# Patient Record
Sex: Female | Born: 2000
Health system: Southern US, Community
[De-identification: ages and names within clinical notes are randomized; demographics above are authoritative.]

## PROBLEM LIST (undated history)

## (undated) DIAGNOSIS — J189 Pneumonia, unspecified organism: Secondary | ICD-10-CM

## (undated) DIAGNOSIS — J210 Acute bronchiolitis due to respiratory syncytial virus: Secondary | ICD-10-CM

---
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
## 2021-01-19 ENCOUNTER — Other Ambulatory Visit: Payer: Self-pay | Admitting: Family Medicine

## 2021-01-19 DIAGNOSIS — N898 Other specified noninflammatory disorders of vagina: Secondary | ICD-10-CM

## 2021-01-20 MED ORDER — FLUCONAZOLE 150 MG PO TABS: 150.0000 mg | ORAL_TABLET | ORAL | 0 refills | Status: DC | PRN

## 2021-01-20 NOTE — Telephone Encounter (Signed)
Received Diflucan refill request in my inbox.  Attempted to call patient to discuss as she recently was seen in our office a few weeks ago for possible pregnancy due to late menstrual cycle, however pregnancy test negative at that time.  Would like to follow-up and confirm her normal menstrual cycle/repeat negative test prior to prescribing this medication.  Allayne Stack, DO

## 2021-01-20 NOTE — Telephone Encounter (Signed)
Called patient to discuss. Had her regular menstrual cycle and negative test, due for next cycle in a few days. Has been sexually active since then using condoms for entirety with each encounter. Having vaginal itching and increased discharge. Will trial diflucan, if not improving instructed to come in for formal evaluation.   Allayne Stack, DO

## 2021-01-31 DIAGNOSIS — Z20822 Contact with and (suspected) exposure to covid-19: Secondary | ICD-10-CM | POA: Diagnosis not present

## 2021-04-25 ENCOUNTER — Ambulatory Visit (INDEPENDENT_AMBULATORY_CARE_PROVIDER_SITE_OTHER): Payer: Medicaid Other | Admitting: Family Medicine

## 2021-04-25 ENCOUNTER — Other Ambulatory Visit (HOSPITAL_COMMUNITY)
Admission: RE | Admit: 2021-04-25 | Discharge: 2021-04-25 | Disposition: A | Discharge status: Home / Self Care | Payer: Medicaid Other | Source: Ambulatory Visit | Attending: Family Medicine | Admitting: Family Medicine

## 2021-04-25 ENCOUNTER — Other Ambulatory Visit: Payer: Self-pay

## 2021-04-25 VITALS — BP 116/79 | HR 77 | Ht 60.0 in | Wt 173.4 lb

## 2021-04-25 DIAGNOSIS — Z113 Encounter for screening for infections with a predominantly sexual mode of transmission: Secondary | ICD-10-CM | POA: Insufficient documentation

## 2021-04-25 DIAGNOSIS — N898 Other specified noninflammatory disorders of vagina: Secondary | ICD-10-CM | POA: Diagnosis present

## 2021-04-25 LAB — POCT WET PREP (WET MOUNT)
Clue Cells Wet Prep Whiff POC: POSITIVE
Trichomonas Wet Prep HPF POC: ABSENT

## 2021-04-25 MED ORDER — METRONIDAZOLE 500 MG PO TABS: 500.0000 mg | ORAL_TABLET | Freq: Two times a day (BID) | ORAL | 0 refills | Status: AC

## 2021-04-25 NOTE — Progress Notes (Signed)
    SUBJECTIVE:   CHIEF COMPLAINT / HPI:   Chief Complaint  Patient presents with   Vaginal odor    Madison Schaefer is a 20 y.o. female presents for :  Vaginal Discharge Having vaginal odor for 7 days. Odor is fishy. Reports similar sx in the past with BV. Denies vaginal discharge.  Medications tried: No  No recent antibiotic use  - Denies itching, burning, abdominal pain, dysuria or hematuria, nausea or vomiting, rash, fevers or pelvic pain.  - Patient reports no STIs in the past.  - Sexully active with female partner.  -  Patient's last menstrual period was 04/12/2021.  - Patient reports  BV, yeast in the past.  - Patient denies douching.  -Contraception: none.  Possible STD exposure: unsure.    PERTINENT  PMH / PSH: reviewed and updated as appropriate   OBJECTIVE:   Ht 5' (1.524 m)   Wt 173 lb 6.4 oz (78.7 kg)   LMP 04/12/2021   BMI 33.86 kg/m   GEN: well appearing female in no acute distress  CVS: well perfused  RESP: speaking in full sentences without pause  ABD: soft, non-tender, non-distended, no palpable masses  Pelvic exam: normal external genitalia, vulva, VAGINA and CERVIX: lesions absent, cervical discharge present - white, DNA probe for chlamydia and GC obtained, cervical motion tenderness absent, ADNEXA: normal adnexa in size, nontender and no masses, WET MOUNT done - results: KOH done, clue cells, excessive bacteria, GC/CT probe collected, exam chaperoned by CMA Tashira.     ASSESSMENT/PLAN:   No problem-specific Assessment & Plan notes found for this encounter.    BV (bacterial vaginosis)  Confirmed on wet prep. G/C Percell Locus is pending. Symptoms consistent with this.  - Treatment: Flagyl 500 BID x 7 days and abstain from coitus during course of treatment. Advised patient to not drink alcohol while taking this medication.  - F/U if symptoms not improving or getting worse.  - Will f/u on G/C Chlamydia and call in Rx if positive.  - Self care  instructions given including avoiding douching. Handout given.  - F/U with PCP as needed.  - Return precautions including abdominal pain, fever, chills, nausea, or vomiting given.   High risk heterosexual behavior GC and chlamydia DNA  probe sent to lab. HIV and RPR offered but declined. Advised patient to use barrier protection/condoms.      Katha Cabal, DO PGY-3, [facility] 04/25/2021

## 2021-04-25 NOTE — Patient Instructions (Signed)
Stop by the pharmacy to pick up your antibiotics. Use barrier protection (condoms), consider hormonal birth control. Be sure to avoid alcoholic beverages while taking your antibiotics as this can cause vomiting.   Take Care,   Dr. Rachael Darby

## 2021-04-26 LAB — CERVICOVAGINAL ANCILLARY ONLY
Chlamydia: NEGATIVE
Comment: NEGATIVE
Comment: NORMAL
Neisseria Gonorrhea: NEGATIVE

## 2021-05-11 ENCOUNTER — Telehealth: Payer: Medicaid Other | Admitting: Nurse Practitioner

## 2021-05-11 DIAGNOSIS — B373 Candidiasis of vulva and vagina: Secondary | ICD-10-CM | POA: Diagnosis not present

## 2021-05-11 DIAGNOSIS — B3731 Acute candidiasis of vulva and vagina: Secondary | ICD-10-CM

## 2021-05-11 MED ORDER — FLUCONAZOLE 150 MG PO TABS: 150.0000 mg | ORAL_TABLET | Freq: Once | ORAL | 0 refills | Status: AC

## 2021-05-11 NOTE — Progress Notes (Signed)

## 2021-05-13 ENCOUNTER — Other Ambulatory Visit: Payer: Self-pay

## 2021-05-13 ENCOUNTER — Encounter (HOSPITAL_COMMUNITY): Payer: Self-pay

## 2021-05-13 ENCOUNTER — Ambulatory Visit (HOSPITAL_COMMUNITY)
Admission: RE | Admit: 2021-05-13 | Discharge: 2021-05-13 | Disposition: A | Discharge status: Home / Self Care | Payer: Medicaid Other | Source: Ambulatory Visit | Attending: Student | Admitting: Student

## 2021-05-13 VITALS — BP 124/86 | HR 95 | Temp 98.1°F | Resp 16

## 2021-05-13 DIAGNOSIS — N760 Acute vaginitis: Secondary | ICD-10-CM | POA: Diagnosis not present

## 2021-05-13 DIAGNOSIS — B9689 Other specified bacterial agents as the cause of diseases classified elsewhere: Secondary | ICD-10-CM | POA: Diagnosis not present

## 2021-05-13 MED ORDER — FLUCONAZOLE 150 MG PO TABS: 150.0000 mg | ORAL_TABLET | Freq: Every day | ORAL | 0 refills | Status: DC

## 2021-05-13 MED ORDER — METRONIDAZOLE 500 MG PO TABS: 500.0000 mg | ORAL_TABLET | Freq: Two times a day (BID) | ORAL | 0 refills | Status: DC

## 2021-05-13 NOTE — ED Triage Notes (Signed)
Pt presents with vaginal itching and odor xs 2-3 days.

## 2021-05-13 NOTE — Discharge Instructions (Addendum)
-  For your yeast infection, start the Diflucan (fluconazole)- Take one pill today (day 1). If you're still having symptoms in 3 days, take the second pill.  -For bacterial vaginosis, start the antibiotic-Flagyl (metronidazole), 2 pills daily for 7 days.  You can take this with food if you have a sensitive stomach.  Avoid alcohol while taking this medication and for 2 days after as this will cause severe nausea and vomiting. -We'll call you in about 3 days if we need to change anything about your treatment plan. Abstain from intercourse until symptoms resolve and treatment is complete. -Seek additional medical attention if new/worsening symptoms.

## 2021-05-13 NOTE — ED Provider Notes (Signed)
[facility]    CSN: [id number] Arrival date & time: 05/13/21  1358      History   Chief Complaint Chief Complaint  Patient presents with   Vaginal Itching    APPT    HPI Madison Schaefer is a 20 y.o. female presenting with vaginal itching and odor x3 days. Medical history BV, last diagnosed with this 04/25/21 by primary care and was treated appropriately with Flagyl, states that the symptoms resolved but now she has 3 days of thick white discharge and vaginal itching once again.  Describes the discharge as thick and malodorous.  Denies new partners, STI risk.  Denies hematuria, dysuria, frequency, urgency, back pain, n/v/d/abd pain, fevers/chills, abdnormal vaginal rashes/lesions.    HPI  Past Medical History:  Diagnosis Date   Pneumonia    RSV (acute bronchiolitis due to respiratory syncytial virus)     Patient Active Problem List   Diagnosis Date Noted   Vaginal discharge 10/27/2020   Amenorrhea 09/30/2019    History reviewed. No pertinent surgical history.  OB History   No obstetric history on file.      Home Medications    Prior to Admission medications   Medication Sig Start Date End Date Taking? Authorizing Provider  fluconazole (DIFLUCAN) 150 MG tablet Take 1 tablet (150 mg total) by mouth daily. -For your yeast infection, start the Diflucan (fluconazole)- Take one pill today (day 1). If you're still having symptoms in 3 days, take the second pill. 05/13/21  Yes Rhys Martini, PA-C  metroNIDAZOLE (FLAGYL) 500 MG tablet Take 1 tablet (500 mg total) by mouth 2 (two) times daily. 05/13/21  Yes Rhys Martini, PA-C    Family History History reviewed. No pertinent family history.  Social History Social History   Tobacco Use   Smoking status: Never   Smokeless tobacco: Never  Vaping Use   Vaping Use: Never used  Substance Use Topics   Alcohol use: No   Drug use: No     Allergies   Patient has no known allergies.   Review of  Systems Review of Systems  Constitutional:  Negative for chills and fever.  HENT:  Negative for sore throat.   Eyes:  Negative for pain and redness.  Respiratory:  Negative for shortness of breath.   Cardiovascular:  Negative for chest pain.  Gastrointestinal:  Negative for abdominal pain, diarrhea, nausea and vomiting.  Genitourinary:  Positive for vaginal discharge. Negative for decreased urine volume, difficulty urinating, dysuria, flank pain, frequency, genital sores, hematuria and urgency.  Musculoskeletal:  Negative for back pain.  Skin:  Negative for rash.  All other systems reviewed and are negative.   Physical Exam Triage Vital Signs ED Triage Vitals  Enc Vitals Group     BP 05/13/21 1415 124/86     Pulse Rate 05/13/21 1415 95     Resp 05/13/21 1415 16     Temp 05/13/21 1415 98.1 F (36.7 C)     Temp Source 05/13/21 1415 Oral     SpO2 05/13/21 1415 98 %     Weight --      Height --      Head Circumference --      Peak Flow --      Pain Score 05/13/21 1418 0     Pain Loc --      Pain Edu? --      Excl. in GC? --    No data found.  Updated Vital Signs BP 124/86 (  BP Location: Left Arm)   Pulse 95   Temp 98.1 F (36.7 C) (Oral)   Resp 16   LMP 04/12/2021   SpO2 98%   Visual Acuity Right Eye Distance:   Left Eye Distance:   Bilateral Distance:    Right Eye Near:   Left Eye Near:    Bilateral Near:     Physical Exam Vitals reviewed.  Constitutional:      General: She is not in acute distress.    Appearance: Normal appearance. She is not ill-appearing.  HENT:     Head: Normocephalic and atraumatic.     Mouth/Throat:     Mouth: Mucous membranes are moist.     Comments: Moist mucous membranes Eyes:     Extraocular Movements: Extraocular movements intact.     Pupils: Pupils are equal, round, and reactive to light.  Cardiovascular:     Rate and Rhythm: Normal rate and regular rhythm.     Heart sounds: Normal heart sounds.  Pulmonary:     Effort:  Pulmonary effort is normal.     Breath sounds: Normal breath sounds. No wheezing, rhonchi or rales.  Abdominal:     General: Bowel sounds are normal. There is no distension.     Palpations: Abdomen is soft. There is no mass.     Tenderness: There is no abdominal tenderness. There is no right CVA tenderness, left CVA tenderness, guarding or rebound.  Genitourinary:    Comments: deferred Skin:    General: Skin is warm.     Capillary Refill: Capillary refill takes less than 2 seconds.     Comments: Good skin turgor  Neurological:     General: No focal deficit present.     Mental Status: She is alert and oriented to person, place, and time.  Psychiatric:        Mood and Affect: Mood normal.        Behavior: Behavior normal.     UC Treatments / Results  Labs (all labs ordered are listed, but only abnormal results are displayed) Labs Reviewed  CERVICOVAGINAL ANCILLARY ONLY    EKG   Radiology No results found.  Procedures Procedures (including critical care time)  Medications Ordered in UC Medications - No data to display  Initial Impression / Assessment and Plan / UC Course  I have reviewed the triage vital signs and the nursing notes.  Pertinent labs & imaging results that were available during my care of the patient were reviewed by me and considered in my medical decision making (see chart for details).     This patient is a very pleasant 20 y.o. year old female presenting with vaginitis.  This patient was recently treated for BV with Flagyl with resolution of symptoms; differential today is BV versus vaginal candidiasis.  She denies STI risk. Will send self swab for G/C, trich, yeast, BV testing. States she is not pregnant or breast-feeding.  Discussed risks and benefits of treating today versus waiting for swab results, patient prefers to treat for BV and yeast today.  Flagyl and Diflucan sent.  ED return precautions discussed. Level 4 review of past notes and labs,  order and interpretation labs, and prescription drug management.   Final Clinical Impressions(s) / UC Diagnoses   Final diagnoses:  Bacterial vaginosis     Discharge Instructions      -For your yeast infection, start the Diflucan (fluconazole)- Take one pill today (day 1). If you're still having symptoms in 3 days, take the second  pill.  -For bacterial vaginosis, start the antibiotic-Flagyl (metronidazole), 2 pills daily for 7 days.  You can take this with food if you have a sensitive stomach.  Avoid alcohol while taking this medication and for 2 days after as this will cause severe nausea and vomiting. -We'll call you in about 3 days if we need to change anything about your treatment plan. Abstain from intercourse until symptoms resolve and treatment is complete. -Seek additional medical attention if new/worsening symptoms.     ED Prescriptions     Medication Sig Dispense Auth. Provider   metroNIDAZOLE (FLAGYL) 500 MG tablet Take 1 tablet (500 mg total) by mouth 2 (two) times daily. 14 tablet Rhys Martini, PA-C   fluconazole (DIFLUCAN) 150 MG tablet Take 1 tablet (150 mg total) by mouth daily. -For your yeast infection, start the Diflucan (fluconazole)- Take one pill today (day 1). If you're still having symptoms in 3 days, take the second pill. 2 tablet Rhys Martini, PA-C      PDMP not reviewed this encounter.   Rhys Martini, PA-C 05/13/21 1511

## 2021-05-16 LAB — CERVICOVAGINAL ANCILLARY ONLY
Bacterial Vaginitis (gardnerella): POSITIVE — AB
Candida Glabrata: NEGATIVE
Candida Vaginitis: NEGATIVE
Chlamydia: NEGATIVE
Comment: NEGATIVE
Comment: NEGATIVE
Comment: NEGATIVE
Comment: NEGATIVE
Comment: NEGATIVE
Comment: NORMAL
Neisseria Gonorrhea: NEGATIVE
Trichomonas: NEGATIVE

## 2021-06-25 ENCOUNTER — Telehealth: Payer: Medicaid Other | Admitting: Nurse Practitioner

## 2021-06-25 DIAGNOSIS — B9689 Other specified bacterial agents as the cause of diseases classified elsewhere: Secondary | ICD-10-CM | POA: Diagnosis not present

## 2021-06-25 DIAGNOSIS — N760 Acute vaginitis: Secondary | ICD-10-CM

## 2021-06-25 MED ORDER — METRONIDAZOLE 500 MG PO TABS: 500.0000 mg | ORAL_TABLET | Freq: Two times a day (BID) | ORAL | 0 refills | Status: AC

## 2021-06-25 NOTE — Progress Notes (Signed)
E-Visit for Vaginal Symptoms  We are sorry that you are not feeling well. Here is how we plan to help! Based on what you shared with me it looks like you: May have a vaginosis due to bacteria  Vaginosis is an inflammation of the vagina that can result in discharge, itching and pain. The cause is usually a change in the normal balance of vaginal bacteria or an infection. Vaginosis can also result from reduced estrogen levels after menopause.  The most common causes of vaginosis are:   Bacterial vaginosis which results from an overgrowth of one on several organisms that are normally present in your vagina.   Yeast infections which are caused by a naturally occurring fungus called candida.   Vaginal atrophy (atrophic vaginosis) which results from the thinning of the vagina from reduced estrogen levels after menopause.   Trichomoniasis which is caused by a parasite and is commonly transmitted by sexual intercourse.  Factors that increase your risk of developing vaginosis include: Medications, such as antibiotics and steroids Uncontrolled diabetes Use of hygiene products such as bubble bath, vaginal spray or vaginal deodorant Douching Wearing damp or tight-fitting clothing Using an intrauterine device (IUD) for birth control Hormonal changes, such as those associated with pregnancy, birth control pills or menopause Sexual activity Having a sexually transmitted infection  Your treatment plan is Metronidazole or Flagyl 500mg twice a day for 7 days.  I have electronically sent this prescription into the pharmacy that you have chosen.  Be sure to take all of the medication as directed. Stop taking any medication if you develop a rash, tongue swelling or shortness of breath. Mothers who are breast feeding should consider pumping and discarding their breast milk while on these antibiotics. However, there is no consensus that infant exposure at these doses would be harmful.  Remember that  medication creams can weaken latex condoms. .   HOME CARE:  Good hygiene may prevent some types of vaginosis from recurring and may relieve some symptoms:  Avoid baths, hot tubs and whirlpool spas. Rinse soap from your outer genital area after a shower, and dry the area well to prevent irritation. Don't use scented or harsh soaps, such as those with deodorant or antibacterial action. Avoid irritants. These include scented tampons and pads. Wipe from front to back after using the toilet. Doing so avoids spreading fecal bacteria to your vagina.  Other things that may help prevent vaginosis include:  Don't douche. Your vagina doesn't require cleansing other than normal bathing. Repetitive douching disrupts the normal organisms that reside in the vagina and can actually increase your risk of vaginal infection. Douching won't clear up a vaginal infection. Use a latex condom. Both female and female latex condoms may help you avoid infections spread by sexual contact. Wear cotton underwear. Also wear pantyhose with a cotton crotch. If you feel comfortable without it, skip wearing underwear to bed. Yeast thrives in moist environments Your symptoms should improve in the next day or two.  GET HELP RIGHT AWAY IF:  You have pain in your lower abdomen ( pelvic area or over your ovaries) You develop nausea or vomiting You develop a fever Your discharge changes or worsens You have persistent pain with intercourse You develop shortness of breath, a rapid pulse, or you faint.  These symptoms could be signs of problems or infections that need to be evaluated by a medical provider now.  MAKE SURE YOU   Understand these instructions. Will watch your condition. Will get help right   away if you are not doing well or get worse.  Thank you for choosing an e-visit.  Your e-visit answers were reviewed by a board certified advanced clinical practitioner to complete your personal care plan. Depending upon the  condition, your plan could have included both over the counter or prescription medications.  Please review your pharmacy choice. Make sure the pharmacy is open so you can pick up prescription now. If there is a problem, you may contact your provider through MyChart messaging and have the prescription routed to another pharmacy.  Your safety is important to us. If you have drug allergies check your prescription carefully.   For the next 24 hours you can use MyChart to ask questions about today's visit, request a non-urgent call back, or ask for a work or school excuse. You will get an email in the next two days asking about your experience. I hope that your e-visit has been valuable and will speed your recovery.   I spent approximately 7 minutes reviewing the patient's history, current symptoms and coordinating their plan of care today.    Meds ordered this encounter  Medications   metroNIDAZOLE (FLAGYL) 500 MG tablet    Sig: Take 1 tablet (500 mg total) by mouth 2 (two) times daily for 7 days.    Dispense:  14 tablet    Refill:  0    

## 2021-10-07 ENCOUNTER — Other Ambulatory Visit: Payer: Self-pay

## 2021-10-07 ENCOUNTER — Ambulatory Visit
Admission: RE | Admit: 2021-10-07 | Discharge: 2021-10-07 | Disposition: A | Discharge status: Home / Self Care | Payer: Medicaid Other | Source: Ambulatory Visit | Attending: Internal Medicine | Admitting: Internal Medicine

## 2021-10-07 VITALS — BP 124/81 | HR 81 | Temp 98.0°F | Resp 18

## 2021-10-07 DIAGNOSIS — N912 Amenorrhea, unspecified: Secondary | ICD-10-CM | POA: Diagnosis not present

## 2021-10-07 LAB — POCT URINALYSIS DIP (MANUAL ENTRY)
Bilirubin, UA: NEGATIVE
Blood, UA: NEGATIVE
Glucose, UA: NEGATIVE mg/dL
Leukocytes, UA: NEGATIVE
Nitrite, UA: NEGATIVE
Protein Ur, POC: NEGATIVE mg/dL
Spec Grav, UA: >=1.03 — AB (ref 1.010–1.025)
Urobilinogen, UA: 0.2 E.U./dL
pH, UA: 5.5 (ref 5.0–8.0)

## 2021-10-07 LAB — POCT URINE PREGNANCY: Preg Test, Ur: NEGATIVE

## 2021-10-07 NOTE — ED Triage Notes (Signed)
Pt reports having missed menstrual cycles since July 2022. Patient states her cycles are usually regular and at home pregnancy test have been negative. Patient denies abd, vaginal and back pain.

## 2021-10-07 NOTE — ED Provider Notes (Signed)
[facility]    CSN: [id number] Arrival date & time: 10/07/21  1325      History   Chief Complaint Chief Complaint  Patient presents with   Amenorrhea    HPI Madison Schaefer is a 21 y.o. female presents to urgent care today with complaints of missed menstrual cycles.  Patient states no menstruation since 7/22.  States she is sexually active with single partner, not using any contraception.  Denies any history of STIs.  Denies any abdominal or pelvic pain, no suspicious lesions, no N/V/D.  Patient also reports approximately 20 pound weight gain since July with no real change in diet.  She has been treated for BV as recent as 8/22 and asymptomatic since that treatment.  Patient without history of abnormal Paps.   Past Medical History:  Diagnosis Date   Pneumonia    RSV (acute bronchiolitis due to respiratory syncytial virus)     Patient Active Problem List   Diagnosis Date Noted   Vaginal discharge 10/27/2020   Amenorrhea 09/30/2019    History reviewed. No pertinent surgical history.  OB History   No obstetric history on file.      Home Medications    Prior to Admission medications   Medication Sig Start Date End Date Taking? Authorizing Provider  fluconazole (DIFLUCAN) 150 MG tablet Take 1 tablet (150 mg total) by mouth daily. -For your yeast infection, start the Diflucan (fluconazole)- Take one pill today (day 1). If you're still having symptoms in 3 days, take the second pill. 05/13/21   Rhys Martini, PA-C    Family History History reviewed. No pertinent family history.  Social History Social History   Tobacco Use   Smoking status: Never   Smokeless tobacco: Never  Vaping Use   Vaping Use: Never used  Substance Use Topics   Alcohol use: No   Drug use: No     Allergies   Patient has no known allergies.   Review of Systems As stated in HPI otherwise negative   Physical Exam Triage Vital Signs ED Triage Vitals  Enc Vitals Group      BP 10/07/21 1358 124/81     Pulse Rate 10/07/21 1358 81     Resp 10/07/21 1358 18     Temp 10/07/21 1358 98 F (36.7 C)     Temp Source 10/07/21 1358 Oral     SpO2 10/07/21 1358 97 %     Weight --      Height --      Head Circumference --      Peak Flow --      Pain Score 10/07/21 1357 0     Pain Loc --      Pain Edu? --      Excl. in GC? --    No data found.  Updated Vital Signs BP 124/81 (BP Location: Right Arm)    Pulse 81    Temp 98 F (36.7 C) (Oral)    Resp 18    SpO2 97%   Visual Acuity Right Eye Distance:   Left Eye Distance:   Bilateral Distance:    Right Eye Near:   Left Eye Near:    Bilateral Near:     Physical Exam Constitutional:      General: She is not in acute distress.    Appearance: Normal appearance. She is not ill-appearing or toxic-appearing.  HENT:     Mouth/Throat:     Mouth: Mucous membranes are moist.  Pharynx: Oropharynx is clear.  Eyes:     Extraocular Movements: Extraocular movements intact.     Conjunctiva/sclera: Conjunctivae normal.  Cardiovascular:     Rate and Rhythm: Normal rate and regular rhythm.  Pulmonary:     Effort: Pulmonary effort is normal.     Breath sounds: Normal breath sounds.  Abdominal:     General: Bowel sounds are normal. There is no distension.     Palpations: Abdomen is soft.     Tenderness: There is no abdominal tenderness. There is no right CVA tenderness, left CVA tenderness or guarding.  Musculoskeletal:        General: Normal range of motion.     Cervical back: Normal range of motion and neck supple.  Skin:    General: Skin is warm and dry.  Neurological:     General: No focal deficit present.     Mental Status: She is alert and oriented to person, place, and time.     UC Treatments / Results  Labs (all labs ordered are listed, but only abnormal results are displayed) Labs Reviewed  POCT URINALYSIS DIP (MANUAL ENTRY) - Abnormal; Notable for the following components:      Result Value    Ketones, POC UA trace (5) (*)    Spec Grav, UA >=1.030 (*)    All other components within normal limits  POCT URINE PREGNANCY  CERVICOVAGINAL ANCILLARY ONLY    EKG   Radiology No results found.  Procedures Procedures (including critical care time)  Medications Ordered in UC Medications - No data to display  Initial Impression / Assessment and Plan / UC Course  I have reviewed the triage vital signs and the nursing notes.  Pertinent labs & imaging results that were available during my care of the patient were reviewed by me and considered in my medical decision making (see chart for details).  Amenorrhea -Unclear etiology.  Urine pregnancy test negative -Suspect related to recent weight gain and/or change in lifestyle as recently starting college out of town -We will screen for STIs -Patient to schedule appointment with PCP as she likely needs further work-up  Reviewed expections re: course of current medical issues. Questions answered. Outlined signs and symptoms indicating need for more acute intervention. Pt verbalized understanding. AVS given  Final Clinical Impressions(s) / UC Diagnoses   Final diagnoses:  Amenorrhea     Discharge Instructions      Your urine pregnancy today is negative.  Your urinalysis does not show any evidence of infection.  Further testing has been sent.  We will notify you should you require any treatment.  As we discussed, you need to follow-up with [facility] for more thorough work-up.  Please call to schedule that appointment     ED Prescriptions   None    PDMP not reviewed this encounter.   Rolla Etienne, NP 10/10/21 [phone]

## 2021-10-07 NOTE — Discharge Instructions (Signed)
Your urine pregnancy today is negative.  Your urinalysis does not show any evidence of infection.  Further testing has been sent.  We will notify you should you require any treatment.  As we discussed, you need to follow-up with [facility] for more thorough work-up.  Please call to schedule that appointment

## 2021-10-11 LAB — CERVICOVAGINAL ANCILLARY ONLY
Bacterial Vaginitis (gardnerella): NEGATIVE
Candida Glabrata: NEGATIVE
Candida Vaginitis: NEGATIVE
Chlamydia: NEGATIVE
Comment: NEGATIVE
Comment: NEGATIVE
Comment: NEGATIVE
Comment: NEGATIVE
Comment: NEGATIVE
Comment: NORMAL
Neisseria Gonorrhea: NEGATIVE
Trichomonas: NEGATIVE

## 2022-01-08 NOTE — Progress Notes (Signed)
? ? ?  SUBJECTIVE:  ? ?CHIEF COMPLAINT / HPI: COVID vaccination and amenorrhea  ? ?Patient presents for COVID vaccination and amenorrhea ? ?She repots that her last menses was in July of 2022.  ?She reports that she has had abnormal menses before at age 21 and again in 2020.   ?Menarche was around age 48-9  ?Previous menses patterns  ?Sexually active: yes and does not use contraception  ?Patient does desire to have children in the future  ?She does not wish to use hormonal contraception  ?Birth control: none currently, has not used contraception in the past   ?Excessive hair: denies  ?Hot flashes: denies  ?Nipple drainage: denies  ?Uterine surgeries: denies  ? ?PERTINENT  PMH / PSH:  ?Amenorrhea  ? ? ?OBJECTIVE:  ? ?BP 134/71   Pulse 72   Wt 196 lb (88.9 kg)   SpO2 99%   BMI 38.28 kg/m?   ?Physical Exam ?Constitutional:   ?   General: She is not in acute distress. ?   Appearance: Normal appearance.  ?HENT:  ?   Nose: Nose normal.  ?   Mouth/Throat:  ?   Mouth: Mucous membranes are moist.  ?   Pharynx: No oropharyngeal exudate or posterior oropharyngeal erythema.  ?Eyes:  ?   General: No scleral icterus. ?   Conjunctiva/sclera: Conjunctivae normal.  ?Cardiovascular:  ?   Rate and Rhythm: Normal rate and regular rhythm.  ?   Heart sounds: Normal heart sounds. No murmur heard. ?Pulmonary:  ?   Effort: Pulmonary effort is normal.  ?   Breath sounds: No wheezing or rales.  ?Abdominal:  ?   General: Bowel sounds are normal. There is no distension.  ?   Palpations: Abdomen is soft. There is no mass.  ?   Tenderness: There is no abdominal tenderness. There is no guarding.  ?Musculoskeletal:  ?   Cervical back: Normal range of motion and neck supple. No rigidity or tenderness.  ?Lymphadenopathy:  ?   Cervical: No cervical adenopathy.  ?Skin: ?   Findings: No erythema or rash.  ?Neurological:  ?   Mental Status: She is alert and oriented to person, place, and time.  ? ? ? ?ASSESSMENT/PLAN:  ? ?Amenorrhea ?Secondary  amenorrhea for 9 months  ?UPT negative today  ?Will measure testosterone, serum free testosterone, FSH, LH, prolactin  ?Previous evaluation in 2020 did not show any hormonal abnormalities  ?Patient does not wish to start hormonal contraception at this time  ?Will plan for 2 week follow up to discuss options moving forward once blood work is completed  ?  ? ? ?Ronnald Ramp, MD ?[facility]  ?

## 2022-01-09 ENCOUNTER — Ambulatory Visit (INDEPENDENT_AMBULATORY_CARE_PROVIDER_SITE_OTHER): Payer: Medicaid Other | Admitting: Family Medicine

## 2022-01-09 ENCOUNTER — Encounter: Payer: Self-pay | Admitting: Family Medicine

## 2022-01-09 ENCOUNTER — Ambulatory Visit (INDEPENDENT_AMBULATORY_CARE_PROVIDER_SITE_OTHER): Payer: Medicaid Other

## 2022-01-09 ENCOUNTER — Other Ambulatory Visit: Payer: Self-pay

## 2022-01-09 VITALS — BP 134/71 | HR 72 | Wt 196.0 lb

## 2022-01-09 DIAGNOSIS — N912 Amenorrhea, unspecified: Secondary | ICD-10-CM

## 2022-01-09 DIAGNOSIS — Z23 Encounter for immunization: Secondary | ICD-10-CM

## 2022-01-09 LAB — POCT URINE PREGNANCY: Preg Test, Ur: NEGATIVE

## 2022-01-09 NOTE — Progress Notes (Signed)
? ?  Covid-19 Vaccination Clinic ? ?Name:  Madison Schaefer    ?MRN: [id number] ?DOB: [birth year] ? ?01/09/2022 ? ?Madison Schaefer was observed post Covid-19 immunization for 15 minutes without incident. She was provided with Vaccine Information Sheet and instruction to access the V-Safe system.  ? ?Madison Schaefer was instructed to call 911 with any severe reactions post vaccine: ?Difficulty breathing  ?Swelling of face and throat  ?A fast heartbeat  ?A bad rash all over body  ?Dizziness and weakness  ? ?   ?

## 2022-01-09 NOTE — Patient Instructions (Signed)
Today we will collect blood work to measure hormones that could be related to your missed periods.  ? ?I will notify you of abnormal results.  ? ?Please follow up with me in 1-2 weeks to review results and discuss other options for your missed periods in the event that your lab work is normal.  ? ? ? ?

## 2022-01-09 NOTE — Assessment & Plan Note (Signed)
Secondary amenorrhea for 9 months  ?UPT negative today  ?Will measure testosterone, serum free testosterone, FSH, LH, prolactin  ?Previous evaluation in 2020 did not show any hormonal abnormalities  ?Patient does not wish to start hormonal contraception at this time  ?Will plan for 2 week follow up to discuss options moving forward once blood work is completed  ?

## 2022-01-10 LAB — TESTOSTERONE: Testosterone: 34 ng/dL (ref 13–71)

## 2022-01-10 LAB — TSH: TSH: 0.655 u[IU]/mL (ref 0.450–4.500)

## 2022-01-10 LAB — PROLACTIN: Prolactin: 8.7 ng/mL (ref 4.8–23.3)

## 2022-01-10 LAB — FSH/LH
FSH: 6.3 m[IU]/mL
LH: 26.6 m[IU]/mL

## 2022-01-11 LAB — TESTOSTERONE, FREE, TOTAL, SHBG
Sex Hormone Binding: 28.8 nmol/L (ref 24.6–122.0)
Testosterone, Free: 2.3 pg/mL
Testosterone: 35 ng/dL (ref 13–71)

## 2022-01-30 ENCOUNTER — Ambulatory Visit: Payer: Medicaid Other

## 2022-01-31 ENCOUNTER — Telehealth: Payer: Self-pay

## 2022-01-31 ENCOUNTER — Telehealth: Payer: Self-pay | Admitting: Family Medicine

## 2022-01-31 DIAGNOSIS — Z111 Encounter for screening for respiratory tuberculosis: Secondary | ICD-10-CM

## 2022-01-31 NOTE — Telephone Encounter (Signed)
Patients mother calls nurse line requesting a QuantiFERON-TB Gold test.  ? ?Patient has a nurse visit on 5/5 for covid vaccine.  ? ?Please place order if appropriate and I will have labs drawn during nurse visit.  ?

## 2022-01-31 NOTE — Telephone Encounter (Signed)
Reviewed form and placed in PCP's box for completion.  Attached copy of Immunization Record.  .Danessa Mensch R Ezabella Teska, CMA  

## 2022-01-31 NOTE — Telephone Encounter (Signed)
Patient's mother dropped off immunization form to be completed. Last DOS was 01/09/22. Placed in [employer].  ?

## 2022-01-31 NOTE — Telephone Encounter (Signed)
Future order placed 

## 2022-02-03 ENCOUNTER — Ambulatory Visit (INDEPENDENT_AMBULATORY_CARE_PROVIDER_SITE_OTHER): Payer: Medicaid Other

## 2022-02-03 ENCOUNTER — Other Ambulatory Visit: Payer: Medicaid Other

## 2022-02-03 DIAGNOSIS — Z23 Encounter for immunization: Secondary | ICD-10-CM | POA: Diagnosis present

## 2022-02-03 DIAGNOSIS — Z111 Encounter for screening for respiratory tuberculosis: Secondary | ICD-10-CM

## 2022-02-06 NOTE — Telephone Encounter (Signed)
Patient calls nurse line checking the status of school form.  ? ?Patient reports the form is due today.  ? ?Patients Quantiferon TB test is not back yet. I spoke with lab and this tests takes typically ~5 days to return.  ? ?Will write patient a letter to give to her school stating we are still awaiting lab results in hopes this holds her spot.  ?

## 2022-02-09 LAB — QUANTIFERON-TB GOLD PLUS
QuantiFERON Mitogen Value: >10 IU/mL
QuantiFERON Nil Value: 2.92 IU/mL
QuantiFERON TB1 Ag Value: 0.58 IU/mL
QuantiFERON TB2 Ag Value: 0.03 IU/mL
QuantiFERON-TB Gold Plus: NEGATIVE

## 2022-03-07 ENCOUNTER — Encounter: Payer: Self-pay | Admitting: *Deleted

## 2022-03-07 NOTE — Progress Notes (Signed)
    SUBJECTIVE:   CHIEF COMPLAINT / HPI: amenorrhea   Patient presents for follow up regarding irregular menses  LMP was July of 2022  She uses no method for contraception  UPT has been negative for multiple visits  She is sexually active and does not use barrier contraception  Patient would like to preserve ability to conceive     PERTINENT  PMH / PSH:  Amenorrhea   OBJECTIVE:   BP 124/80   Pulse 85   Ht 5' (1.524 m)   Wt 191 lb 3.2 oz (86.7 kg)   LMP 04/15/2021   SpO2 100%   BMI 37.34 kg/m   Physical Exam Constitutional:      Appearance: She is obese. She is not ill-appearing.  HENT:     Right Ear: External ear normal.     Left Ear: External ear normal.     Nose: Nose normal.     Mouth/Throat:     Pharynx: Oropharynx is clear.  Cardiovascular:     Rate and Rhythm: Normal rate and regular rhythm.  Pulmonary:     Effort: Pulmonary effort is normal.  Abdominal:     General: Bowel sounds are normal.     Palpations: Abdomen is soft.  Musculoskeletal:     Cervical back: Normal range of motion.  Skin:    General: Skin is warm.  Neurological:     General: No focal deficit present.     Mental Status: She is alert and oriented to person, place, and time.      ASSESSMENT/PLAN:   Amenorrhea UPT negative today  Will complete progesterone withdrawal trial for 10 days, provera prescribed  Will then have patient start OCP after withdrawal bleeding and thn follow up  Discussed potential for PCOS, patient may benefit from ultrasound if progesterone challenge is unsuccessful      Ronnald Ramp, MD [facility]

## 2022-03-08 ENCOUNTER — Encounter: Payer: Self-pay | Admitting: Family Medicine

## 2022-03-08 ENCOUNTER — Ambulatory Visit (INDEPENDENT_AMBULATORY_CARE_PROVIDER_SITE_OTHER): Payer: Medicaid Other | Admitting: Family Medicine

## 2022-03-08 VITALS — BP 124/80 | HR 85 | Ht 60.0 in | Wt 191.2 lb

## 2022-03-08 DIAGNOSIS — N912 Amenorrhea, unspecified: Secondary | ICD-10-CM

## 2022-03-08 LAB — POCT URINE PREGNANCY: Preg Test, Ur: NEGATIVE

## 2022-03-08 MED ORDER — NORGESTIMATE-ETH ESTRADIOL 0.25-35 MG-MCG PO TABS: 1.0000 | ORAL_TABLET | Freq: Every day | ORAL | 11 refills | Status: DC

## 2022-03-08 MED ORDER — MEDROXYPROGESTERONE ACETATE 10 MG PO TABS: 10.0000 mg | ORAL_TABLET | Freq: Every day | ORAL | 0 refills | Status: DC

## 2022-03-08 NOTE — Patient Instructions (Signed)
Please take the Provera pills once daily for the next ten days.   After completing the ten pills, we are looking for you to have a withdrawal bleed.   Once this is completed we can start the oral contraception pills daily to get your cycle back on track.

## 2022-03-11 NOTE — Assessment & Plan Note (Signed)
UPT negative today  Will complete progesterone withdrawal trial for 10 days, provera prescribed  Will then have patient start OCP after withdrawal bleeding and thn follow up  Discussed potential for PCOS, patient may benefit from ultrasound if progesterone challenge is unsuccessful

## 2022-05-11 ENCOUNTER — Telehealth: Payer: Medicaid Other | Admitting: Physician Assistant

## 2022-05-11 DIAGNOSIS — B9689 Other specified bacterial agents as the cause of diseases classified elsewhere: Secondary | ICD-10-CM

## 2022-05-11 DIAGNOSIS — N760 Acute vaginitis: Secondary | ICD-10-CM

## 2022-05-11 MED ORDER — METRONIDAZOLE 500 MG PO TABS: 500.0000 mg | ORAL_TABLET | Freq: Two times a day (BID) | ORAL | 0 refills | Status: DC

## 2022-05-11 NOTE — Progress Notes (Signed)
I have spent 5 minutes in review of e-visit questionnaire, review and updating patient chart, medical decision making and response to patient.   Horacio Werth Cody Dereonna Lensing, PA-C    

## 2022-05-11 NOTE — Progress Notes (Signed)

## 2022-07-21 ENCOUNTER — Ambulatory Visit (INDEPENDENT_AMBULATORY_CARE_PROVIDER_SITE_OTHER): Payer: Medicaid Other | Admitting: Family Medicine

## 2022-07-21 ENCOUNTER — Encounter: Payer: Self-pay | Admitting: Family Medicine

## 2022-07-21 VITALS — BP 119/82 | HR 80 | Ht 60.0 in | Wt 203.0 lb

## 2022-07-21 DIAGNOSIS — N912 Amenorrhea, unspecified: Secondary | ICD-10-CM | POA: Diagnosis not present

## 2022-07-21 DIAGNOSIS — Z3200 Encounter for pregnancy test, result unknown: Secondary | ICD-10-CM | POA: Diagnosis not present

## 2022-07-21 DIAGNOSIS — R091 Pleurisy: Secondary | ICD-10-CM | POA: Diagnosis not present

## 2022-07-21 DIAGNOSIS — R079 Chest pain, unspecified: Secondary | ICD-10-CM | POA: Diagnosis not present

## 2022-07-21 LAB — POCT URINE PREGNANCY: Preg Test, Ur: NEGATIVE

## 2022-07-21 NOTE — Assessment & Plan Note (Signed)
Seems to be an ongoing issue. Upreg negative today. Serum bHCG checked. As discussed with her, if this is negative as well, I will recommend full ovulatory work-up. She agreed with the plan.

## 2022-07-21 NOTE — Patient Instructions (Addendum)
Secondary Amenorrhea  Secondary amenorrhea occurs when a female who was previously having menstrual periods has not had them for 3-6 months. A menstrual period is the monthly shedding of the lining of the uterus. The lining of the uterus is made up of blood, tissue, fluid, and mucus. The flow of blood usually occurs during 3-7 consecutive days each month. This condition has many causes. In many cases, treating the underlying cause will return menstrual periods back to a normal cycle. What are the causes? The most common cause of this condition is pregnancy. Other medical conditions that can cause secondary amenorrhea include: Cirrhosis of the liver. Conditions of the blood. Diabetes. Epilepsy. Chronic kidney disease. Polycystic ovary disease. A hormonal imbalance. Ovarian failure. Cystic fibrosis. Early menopause. Cushing syndrome. Thyroid problems. Other causes may include: Malnutrition. Stress or anxiety. Medicines. Extreme obesity. Low body weight or drastic weight loss. Removal of the ovaries or uterus. Contraceptive pills, patches, or vaginal rings. What increases the risk? You are more likely to develop this condition if: You have a family history of this condition. You have an eating disorder. You do extreme athletic training. You have a chronic disease. You abuse substances such as alcohol or cigarettes. What are the signs or symptoms? The main symptom of this condition is a lack of menstrual periods for 3-6 months in a female who previously had menstrual periods. How is this diagnosed? This condition may be diagnosed based on: Your medical history. A physical exam. A pelvic exam to check for problems with your reproductive organs. A procedure to examine the uterus. A measurement of your body mass index (BMI). You may also have other tests, including: Blood tests that measure certain hormones in your body and rule out pregnancy. Urine tests. Imaging tests, such as  an ultrasound, CT scan, or MRI. How is this treated? Treatment for this condition depends on the cause of the amenorrhea. It may involve: Correcting diet-related problems. Treating underlying conditions. Medicines. Lifestyle changes. Surgery. If the condition cannot be corrected, it is sometimes possible to start menstrual periods with medicines. Follow these instructions at home: Lifestyle     Maintain a healthy diet. In general, a healthy diet includes lots of fruits and vegetables, low-fat dairy products, lean meats, and foods that contain fiber. Ask to meet with a registered dietitian for nutrition counseling and meal planning. Maintain a healthy weight. Talk to your health care provider before trying any new diet or exercise plan. Exercise at least 30 minutes 5 or more days each week. Exercising includes brisk walking, yard work, biking, running, swimming, and team sports like basketball and soccer. Ask your health care provider which exercises are safe for you. Get enough sleep. Plan your sleep time to allow for 7-9 hours of sleep each night. Learn to manage stress. Explore relaxation techniques such as meditation, journaling, yoga, or tai chi. General instructions Be aware of changes in your menstrual cycle. Keep a record of when you have your menstrual period. Note the date your period starts, how long it lasts, and any problems you experience. Take over-the-counter and prescription medicines only as told by your health care provider. Keep all follow-up visits. This is important. Contact a health care provider if: Your periods do not return to normal after treatment. Summary Secondary amenorrhea is when a female who was previously having menstrual periods has not gotten her period for 3-6 months. This condition has many causes. In many cases, treating the underlying cause will return menstrual periods back to a  normal cycle. Talk to your health care provider if your periods do not  return to normal after treatment. This information is not intended to replace advice given to you by your health care provider. Make sure you discuss any questions you have with your health care provider. Document Revised: 05/05/2020 Document Reviewed: 05/05/2020 Elsevier Patient Education  2023 Elsevier Inc.  

## 2022-07-21 NOTE — Progress Notes (Signed)
    SUBJECTIVE:   CHIEF COMPLAINT / HPI:   HPI: Pregnancy concern:  She feels she might be pregnant. Her LPM was in August of 2023. Her period has been irregular lately. She endorses breast soreness for two weeks, and she always feels tired. She had three negative home pregnancy tests. She is sexually active and not on birth control, and she is not trying to get pregnant. Her last sexual activity was one week ago. She denies GU and GI symptoms.   Chest pain: C/O left-sided chest soreness radiating to her right x 3 weeks. Her chest pain is worse with movement and sometimes with deep breaths.  No cough, no SOB, no fever, no sick contact. No previous episode, no trauma. Not improving. Pain is about 8/10  No treatment was tried.  PERTINENT  PMH / PSH: PMHx reviewed  OBJECTIVE:   BP 119/82   Pulse 80   Ht 5' (1.524 m)   Wt 203 lb (92.1 kg)   LMP 05/28/2022   SpO2 100%   BMI 39.65 kg/m   Physical Exam Vitals and nursing note reviewed.  Cardiovascular:     Rate and Rhythm: Normal rate and regular rhythm.     Heart sounds: Normal heart sounds. No murmur heard. Pulmonary:     Effort: Pulmonary effort is normal. No respiratory distress.     Breath sounds: Normal breath sounds. No wheezing.  Abdominal:     General: Abdomen is flat. Bowel sounds are normal. There is no distension.     Palpations: Abdomen is soft. There is no mass.     Tenderness: There is no abdominal tenderness.      ASSESSMENT/PLAN:   Amenorrhea Seems to be an ongoing issue. Upreg negative today. Serum bHCG checked. As discussed with her, if this is negative as well, I will recommend full ovulatory work-up. She agreed with the plan.   Pleurisy Conservative measure discussed. She prefers to get chest xray done. She is aware of potential impact of radiation on fetus - although upreg is negative, serum hcg is pending. Xray ordered. I will contact her soon with her result.      Andrena Mews, MD [place]

## 2022-07-21 NOTE — Assessment & Plan Note (Addendum)
Conservative measure discussed. She prefers to get chest xray done. She is aware of potential impact of radiation on fetus - although upreg is negative, serum hcg is pending. Xray ordered. I will contact her soon with her result.

## 2022-07-22 LAB — HCG, SERUM, QUALITATIVE: hCG,Beta Subunit,Qual,Serum: NEGATIVE m[IU]/mL (ref <6)

## 2022-07-28 ENCOUNTER — Telehealth: Payer: Self-pay | Admitting: Family Medicine

## 2022-07-28 NOTE — Telephone Encounter (Signed)
HIPAA compliant callback message left. It seems she did not check her MyChart message about neg serum pregnancy test.  Please, advise her that her pregnancy test is negative and help her schedule PCP f/u when she calls for further evaluation of her amenorrhea.  Thanks.

## 2022-08-24 ENCOUNTER — Encounter (HOSPITAL_BASED_OUTPATIENT_CLINIC_OR_DEPARTMENT_OTHER): Payer: Self-pay | Admitting: Emergency Medicine

## 2022-08-24 ENCOUNTER — Emergency Department (HOSPITAL_BASED_OUTPATIENT_CLINIC_OR_DEPARTMENT_OTHER): Payer: Medicaid Other

## 2022-08-24 ENCOUNTER — Emergency Department (HOSPITAL_BASED_OUTPATIENT_CLINIC_OR_DEPARTMENT_OTHER)
Admission: EM | Admit: 2022-08-24 | Discharge: 2022-08-24 | Disposition: A | Discharge status: Home / Self Care | Payer: Medicaid Other | Attending: Emergency Medicine | Admitting: Emergency Medicine

## 2022-08-24 ENCOUNTER — Other Ambulatory Visit: Payer: Self-pay

## 2022-08-24 DIAGNOSIS — S6992XA Unspecified injury of left wrist, hand and finger(s), initial encounter: Secondary | ICD-10-CM

## 2022-08-24 DIAGNOSIS — S61301A Unspecified open wound of left index finger with damage to nail, initial encounter: Secondary | ICD-10-CM | POA: Diagnosis not present

## 2022-08-24 DIAGNOSIS — X500XXA Overexertion from strenuous movement or load, initial encounter: Secondary | ICD-10-CM | POA: Insufficient documentation

## 2022-08-24 MED ORDER — ACETAMINOPHEN 325 MG PO TABS
650.0000 mg | ORAL_TABLET | Freq: Once | ORAL | Status: AC
  Administered 2022-08-24: 650 mg via ORAL
  Filled 2022-08-24: qty 2

## 2022-08-24 NOTE — ED Triage Notes (Signed)
Reports her fingernail is coming off of her left index finger. Injured it ~ 1 hr pta.

## 2022-08-24 NOTE — ED Provider Notes (Signed)
[facility] EMERGENCY DEPARTMENT Provider Note   CSN: [id number] Arrival date & time: 08/24/22  2028     History  Chief Complaint  Patient presents with   Nail Problem    Madison Schaefer is a 21 y.o. female with no significant past medical history who presents to the ED due to left index finger injury.  Patient states her acrylic nail got caught bending her fingernail backwards.  Patient states her fingernail is slightly avulsed from her skin.  No bleeding.  Patient mitts to significant pain to left index finger.       Home Medications Prior to Admission medications   Medication Sig Start Date End Date Taking? Authorizing Provider  fluconazole (DIFLUCAN) 150 MG tablet Take 1 tablet (150 mg total) by mouth daily. -For your yeast infection, start the Diflucan (fluconazole)- Take one pill today (day 1). If you're still having symptoms in 3 days, take the second pill. Patient not taking: Reported on 07/21/2022 05/13/21   Rhys Martini, PA-C  medroxyPROGESTERone (PROVERA) 10 MG tablet Take 1 tablet (10 mg total) by mouth daily for 10 days. 03/08/22 03/18/22  Simmons-Robinson, Tawanna Cooler, MD  metroNIDAZOLE (FLAGYL) 500 MG tablet Take 1 tablet (500 mg total) by mouth 2 (two) times daily. Patient not taking: Reported on 07/21/2022 05/11/22   Waldon Merl, PA-C  norgestimate-ethinyl estradiol (SPRINTEC 28) 0.25-35 MG-MCG tablet Take 1 tablet by mouth daily. Patient not taking: Reported on 07/21/2022 03/08/22   Ronnald Ramp, MD      Allergies    Patient has no known allergies.    Review of Systems   Review of Systems  Musculoskeletal:  Positive for arthralgias.  Skin:  Positive for wound.  All other systems reviewed and are negative.   Physical Exam Updated Vital Signs BP (!) 137/92 (BP Location: Right Arm)   Pulse 95   Temp 98.3 F (36.8 C) (Oral)   Resp 17   LMP 08/02/2022 (Exact Date)   SpO2 100%  Physical Exam Vitals and nursing note reviewed.   Constitutional:      General: She is not in acute distress.    Appearance: She is not ill-appearing.  HENT:     Head: Normocephalic.  Eyes:     Pupils: Pupils are equal, round, and reactive to light.  Cardiovascular:     Rate and Rhythm: Normal rate and regular rhythm.     Pulses: Normal pulses.     Heart sounds: Normal heart sounds. No murmur heard.    No friction rub. No gallop.  Pulmonary:     Effort: Pulmonary effort is normal.     Breath sounds: Normal breath sounds.  Abdominal:     General: Abdomen is flat. There is no distension.     Palpations: Abdomen is soft.     Tenderness: There is no abdominal tenderness. There is no guarding or rebound.  Musculoskeletal:        General: Normal range of motion.     Cervical back: Neck supple.     Comments: Long acrylic nail to left index finger.  Mild avulsion of fingernail from skin on distal portion.  Nailbed intact.  No bleeding.  No lacerations.  Skin:    General: Skin is warm and dry.  Neurological:     General: No focal deficit present.     Mental Status: She is alert.  Psychiatric:        Mood and Affect: Mood normal.        Behavior:  Behavior normal.     ED Results / Procedures / Treatments   Labs (all labs ordered are listed, but only abnormal results are displayed) Labs Reviewed - No data to display  EKG None  Radiology DG Finger Index Left  Result Date: 08/24/2022 CLINICAL DATA:  Left index finger injury. EXAM: LEFT INDEX FINGER 2+V COMPARISON:  None Available. FINDINGS: There is no evidence of fracture or dislocation. There is no evidence of arthropathy or other focal bone abnormality. Soft tissues are unremarkable. IMPRESSION: Negative. Electronically Signed   By: Thornell Sartorius M.D.   On: 08/24/2022 21:03    Procedures Procedures    Medications Ordered in ED Medications  acetaminophen (TYLENOL) tablet 650 mg (has no administration in time range)    ED Course/ Medical Decision Making/ A&P                            Medical Decision Making Amount and/or Complexity of Data Reviewed Radiology: ordered and independent interpretation performed. Decision-making details documented in ED Course.  Risk OTC drugs.   21 year old female presents to the ED due to left index finger injury.  Patient states her acrylic nail got caught, bending it backwards.  Patient notes her fingernail is slightly avulsed from the skin at the distal portion.  Upon arrival, stable vitals.  Patient in no acute distress.  Long acrylic fingernails.  Mild avulsion of distal portion of fingernail from nail bed of left index finger. No underlying lacerations.  X-ray ordered at triage which I personally reviewed and interpreted which is negative for any bony fractures.  Attempted to cut acrylic nail shorter however, unable to cut acrylic nail.  Advised patient to go to nail salon tomorrow to have her fingernail removed.  Finger wrapped here in the ED.  Patient given Tylenol with improvement in pain. Wound care discussed with patient. Strict ED precautions discussed with patient. Patient states understanding and agrees to plan. Patient discharged home in no acute distress and stable vitals        Final Clinical Impression(s) / ED Diagnoses Final diagnoses:  Injury to fingernail of left hand, initial encounter    Rx / DC Orders ED Discharge Orders     None         Jesusita Oka 08/24/22 2122    Charlynne Pander, MD 08/25/22 [phone]

## 2022-08-24 NOTE — Discharge Instructions (Signed)
It was a pleasure taking care of you today.  As discussed, you will need your acrylic nail removed or cut.  Fingernail will need to grow out. You may take over-the-counter ibuprofen or Tylenol as needed for pain.  Your x-ray did not show any underlying broken bones.  Return to the ER for new or worsening symptoms.

## 2022-10-08 ENCOUNTER — Emergency Department (HOSPITAL_BASED_OUTPATIENT_CLINIC_OR_DEPARTMENT_OTHER): Payer: Medicaid Other

## 2022-10-08 ENCOUNTER — Emergency Department (HOSPITAL_BASED_OUTPATIENT_CLINIC_OR_DEPARTMENT_OTHER)
Admission: EM | Admit: 2022-10-08 | Discharge: 2022-10-08 | Disposition: A | Discharge status: Home / Self Care | Payer: Medicaid Other | Attending: Emergency Medicine | Admitting: Emergency Medicine

## 2022-10-08 ENCOUNTER — Encounter (HOSPITAL_BASED_OUTPATIENT_CLINIC_OR_DEPARTMENT_OTHER): Payer: Self-pay | Admitting: Emergency Medicine

## 2022-10-08 ENCOUNTER — Other Ambulatory Visit: Payer: Self-pay

## 2022-10-08 DIAGNOSIS — R0789 Other chest pain: Secondary | ICD-10-CM | POA: Diagnosis not present

## 2022-10-08 DIAGNOSIS — R079 Chest pain, unspecified: Secondary | ICD-10-CM | POA: Diagnosis not present

## 2022-10-08 LAB — BASIC METABOLIC PANEL
Anion gap: 7 (ref 5–15)
BUN: 9 mg/dL (ref 6–20)
CO2: 28 mmol/L (ref 22–32)
Calcium: 8.7 mg/dL — ABNORMAL LOW (ref 8.9–10.3)
Chloride: 101 mmol/L (ref 98–111)
Creatinine, Ser: 0.84 mg/dL (ref 0.44–1.00)
GFR, Estimated: >60 mL/min (ref >60)
Glucose, Bld: 98 mg/dL (ref 70–99)
Potassium: 3.7 mmol/L (ref 3.5–5.1)
Sodium: 136 mmol/L (ref 135–145)

## 2022-10-08 LAB — TROPONIN I (HIGH SENSITIVITY): Troponin I (High Sensitivity): <2 ng/L (ref <18)

## 2022-10-08 LAB — CBC
HCT: 44.1 % (ref 36.0–46.0)
Hemoglobin: 13.5 g/dL (ref 12.0–15.0)
MCH: 22.2 pg — ABNORMAL LOW (ref 26.0–34.0)
MCHC: 30.6 g/dL (ref 30.0–36.0)
MCV: 72.4 fL — ABNORMAL LOW (ref 80.0–100.0)
Platelets: 383 10*3/uL (ref 150–400)
RBC: 6.09 MIL/uL — ABNORMAL HIGH (ref 3.87–5.11)
RDW: 14.3 % (ref 11.5–15.5)
WBC: 9.1 10*3/uL (ref 4.0–10.5)
nRBC: 0 % (ref 0.0–0.2)

## 2022-10-08 MED ORDER — IBUPROFEN 600 MG PO TABS: 600.0000 mg | ORAL_TABLET | Freq: Three times a day (TID) | ORAL | 0 refills | Status: DC | PRN

## 2022-10-08 MED ORDER — IBUPROFEN 400 MG PO TABS
600.0000 mg | ORAL_TABLET | Freq: Once | ORAL | Status: AC
  Administered 2022-10-08: 600 mg via ORAL
  Filled 2022-10-08: qty 1

## 2022-10-08 NOTE — ED Notes (Signed)
Pt would like to speak to provider before she is d/c in regards to cardiology referral and further testing of her heart. MD Trifan at the bedside now.

## 2022-10-08 NOTE — ED Provider Notes (Addendum)
[facility] EMERGENCY DEPARTMENT Provider Note   CSN: [id number] Arrival date & time: 10/08/22  1434     History  Chief Complaint  Patient presents with   Chest Pain    Madison Schaefer is a 22 y.o. female presenting to the ED with left-sided chest pain.  She said this been going on and off for the past 3 to 4 months.  The pain will start gradually at random, not associated with exertion, last a few days and go away.  There is always the same sense of pressure in the left upper side of her chest, worse with deep inspiration and stretching her arm.  When she is asymptomatic she has no pain at all.  Began again a few days ago.  She denies history of DVT or PE, estrogen use, hormone use, recent surgery or prolonged immobilization.  She denies any other significant medical history, or any significant family history of clotting or MI  She has not taken any medications at home for her symptoms.  HPI     Home Medications Prior to Admission medications   Medication Sig Start Date End Date Taking? Authorizing Provider  ibuprofen (ADVIL) 600 MG tablet Take 1 tablet (600 mg total) by mouth every 8 (eight) hours as needed for up to 30 doses for mild pain or moderate pain (Chest wall pain). 10/08/22  Yes Terald Sleeper, MD  fluconazole (DIFLUCAN) 150 MG tablet Take 1 tablet (150 mg total) by mouth daily. -For your yeast infection, start the Diflucan (fluconazole)- Take one pill today (day 1). If you're still having symptoms in 3 days, take the second pill. Patient not taking: Reported on 07/21/2022 05/13/21   Rhys Martini, PA-C  medroxyPROGESTERone (PROVERA) 10 MG tablet Take 1 tablet (10 mg total) by mouth daily for 10 days. 03/08/22 03/18/22  Simmons-Robinson, Tawanna Cooler, MD  metroNIDAZOLE (FLAGYL) 500 MG tablet Take 1 tablet (500 mg total) by mouth 2 (two) times daily. Patient not taking: Reported on 07/21/2022 05/11/22   Waldon Merl, PA-C  norgestimate-ethinyl estradiol  (SPRINTEC 28) 0.25-35 MG-MCG tablet Take 1 tablet by mouth daily. Patient not taking: Reported on 07/21/2022 03/08/22   Ronnald Ramp, MD      Allergies    Patient has no known allergies.    Review of Systems   Review of Systems  Physical Exam Updated Vital Signs BP 126/83   Pulse 73   Temp 98.3 F (36.8 C) (Oral)   Resp 16   Wt 86.2 kg   LMP 09/15/2022   SpO2 100%   BMI 37.11 kg/m  Physical Exam Constitutional:      General: She is not in acute distress.    Appearance: She is well-developed.  HENT:     Head: Normocephalic and atraumatic.  Eyes:     Conjunctiva/sclera: Conjunctivae normal.     Pupils: Pupils are equal, round, and reactive to light.  Cardiovascular:     Rate and Rhythm: Normal rate and regular rhythm.     Comments: Mild left anterior chest wall tenderness Pulmonary:     Effort: Pulmonary effort is normal. No respiratory distress.  Abdominal:     General: There is no distension.     Tenderness: There is no abdominal tenderness.  Skin:    General: Skin is warm and dry.  Neurological:     General: No focal deficit present.     Mental Status: She is alert. Mental status is at baseline.  Psychiatric:  Mood and Affect: Mood normal.        Behavior: Behavior normal.     ED Results / Procedures / Treatments   Labs (all labs ordered are listed, but only abnormal results are displayed) Labs Reviewed  BASIC METABOLIC PANEL - Abnormal; Notable for the following components:      Result Value   Calcium 8.7 (*)    All other components within normal limits  CBC - Abnormal; Notable for the following components:   RBC 6.09 (*)    MCV 72.4 (*)    MCH 22.2 (*)    All other components within normal limits  PREGNANCY, URINE  TROPONIN I (HIGH SENSITIVITY)    EKG EKG Interpretation  Date/Time:  Sunday October 08 2022 14:54:40 EST Ventricular Rate:  71 PR Interval:  150 QRS Duration: 81 QT Interval:  389 QTC Calculation: 423 R  Axis:   45 Text Interpretation: Sinus rhythm Borderline T abnormalities, diffuse leads Confirmed by Regan Lemming ([id number]) on 10/08/2022 3:09:23 PM  Radiology DG Chest 2 View  Result Date: 10/08/2022 CLINICAL DATA:  Chest pain EXAM: CHEST - 2 VIEW COMPARISON:  March 22, 2018 FINDINGS: The heart size and mediastinal contours are within normal limits. Both lungs are clear. The visualized skeletal structures are unremarkable. IMPRESSION: No active cardiopulmonary disease. Electronically Signed   By: Dorise Bullion III M.D.   On: 10/08/2022 15:09    Procedures Procedures    Medications Ordered in ED Medications - No data to display  ED Course/ Medical Decision Making/ A&P                           Medical Decision Making Amount and/or Complexity of Data Reviewed Labs: ordered. Radiology: ordered.  Risk Prescription drug management.   Patient is here with left-sided chest pain intermittent for several months.  She does not have any acute risk factors for PE or MI.  PERC negative.  I have a low suspicion for PE and do not feel she needs a CT pulmonary embolism scan at this time.  Most likely this is costochondritis or muscle wall pain.  She is well-developed with larger breast size which may be causing some strain on the anterior chest wall.  She has reproducible tenderness to palpation and also with movement.  I did recommend that she take ibuprofen and will prescribe this except for milligrams when her pain flares up.  More importantly she will need to establish care with a PCP.  I personally reviewed and interpreted patient's labs and EKG, no life-threatening emergency is found.  Troponin is normal.  EKG shows normal sinus rhythm without acute ischemic findings.  I reviewed interpreted her x-ray which is also unremarkable.  No evidence of pneumonia.  Doubt myocarditis, pericarditis, aortic dissection or other life-threatening condition.  She is stable for discharge  *  At the time of  discharge the patient's mother repeatedly requested a referral to see a cardiologist.  I did explain that I think it is quite unlikely that this is a coronary syndrome or coronary disease, but I am willing to place a referral for them to get a second specialist opinion.  They are content with this plan.  Referral placed    Final Clinical Impression(s) / ED Diagnoses Final diagnoses:  Chest wall pain    Rx / DC Orders ED Discharge Orders          Ordered    ibuprofen (ADVIL) 600 MG tablet  Every 8 hours PRN        10/08/22 1945    Ambulatory referral to Cardiology        10/08/22 1952              Terald Sleeper, MD 10/08/22 1944    Terald Sleeper, MD 10/08/22 [phone]

## 2022-10-08 NOTE — ED Triage Notes (Signed)
Pt arrives pov, steady gait, c/o chronic, intermittent, non radiating CP x 3 months. Also endorses dizziness intermittently, denies at this time. Pt denies shob, denies recent illness

## 2022-10-10 ENCOUNTER — Telehealth: Payer: Self-pay | Admitting: *Deleted

## 2022-10-10 NOTE — Patient Outreach (Signed)
  Care Coordination [facility] Note Transition Care Management Follow-up Telephone Call Date of discharge and from where: 10/08/22 from [facility] How have you been since you were released from the hospital? Patient reports doing better since ED visit Any questions or concerns? No  Items Reviewed: Did the pt receive and understand the discharge instructions provided? Yes  Medications obtained and verified?  Planning to pick up ibuprofen today Other? No  Any new allergies since your discharge? No  Dietary orders reviewed? No Do you have support at home? Yes   Home Care and Equipment/Supplies: Were home health services ordered? no If so, what is the name of the agency? N/A  Has the agency set up a time to come to the patient's home? not applicable Were any new equipment or medical supplies ordered?  No What is the name of the medical supply agency? N/A Were you able to get the supplies/equipment? not applicable Do you have any questions related to the use of the equipment or supplies? No  Functional Questionnaire: (I = Independent and D = Dependent) ADLs: I  Bathing/Dressing- I  Meal Prep- I  Eating- I  Maintaining continence- I  Transferring/Ambulation- I  Managing Meds- I  Follow up appointments reviewed:  PCP Hospital f/u appt confirmed?  N/A, ED visit  . [facility] f/u appt confirmed? Yes  Scheduled to see Cardiology on 10/31/22 @ 8:40am. Are transportation arrangements needed? No  If their condition worsens, is the pt aware to call PCP or go to the Emergency Dept.? Yes Was the patient provided with contact information for the PCP's office or ED? No Was to pt encouraged to call back with questions or concerns? Yes  SDOH assessments and interventions completed:   Yes   Lurena Joiner RN, Ronda RN Care Coordinator

## 2022-10-24 ENCOUNTER — Telehealth: Payer: Self-pay

## 2022-10-24 NOTE — Telephone Encounter (Signed)
Called pt to come in later on Jan 30th or reschedule. No answer at this time. Left message for pt to return the call.  

## 2022-10-31 ENCOUNTER — Ambulatory Visit: Payer: Medicaid Other | Admitting: Cardiology

## 2022-11-09 ENCOUNTER — Ambulatory Visit: Payer: Medicaid Other

## 2022-11-10 ENCOUNTER — Ambulatory Visit
Admission: RE | Admit: 2022-11-10 | Discharge: 2022-11-10 | Disposition: A | Discharge status: Home / Self Care | Payer: Medicaid Other | Source: Ambulatory Visit | Attending: Nurse Practitioner

## 2022-11-10 VITALS — BP 124/79 | HR 85 | Temp 98.4°F | Resp 16

## 2022-11-10 DIAGNOSIS — N926 Irregular menstruation, unspecified: Secondary | ICD-10-CM

## 2022-11-10 DIAGNOSIS — Z3200 Encounter for pregnancy test, result unknown: Secondary | ICD-10-CM

## 2022-11-10 LAB — POCT URINE PREGNANCY: Preg Test, Ur: NEGATIVE

## 2022-11-10 NOTE — Discharge Instructions (Signed)
The clinic will contact you with results of the blood work if positive Follow-up with your PCP as needed

## 2022-11-10 NOTE — ED Provider Notes (Signed)
[facility]    CSN: [id number] Arrival date & time: 11/10/22  1259      History   Chief Complaint Chief Complaint  Patient presents with   Possible Pregnancy    Entered by patient    HPI Madison Schaefer is a 22 y.o. female presents for pregnancy testing.  Patient reports her last period was the end of December.  She does report irregular menses and skipping months in the past.  She took a home pregnancy test yesterday that had a faint line and she wants to confirm.  She is not on any birth control.  No other concerns at this time.   Possible Pregnancy    Past Medical History:  Diagnosis Date   Pneumonia    RSV (acute bronchiolitis due to respiratory syncytial virus)     Patient Active Problem List   Diagnosis Date Noted   Pleurisy 07/21/2022   Vaginal discharge 10/27/2020   Amenorrhea 09/30/2019    History reviewed. No pertinent surgical history.  OB History   No obstetric history on file.      Home Medications    Prior to Admission medications   Medication Sig Start Date End Date Taking? Authorizing Provider  fluconazole (DIFLUCAN) 150 MG tablet Take 1 tablet (150 mg total) by mouth daily. -For your yeast infection, start the Diflucan (fluconazole)- Take one pill today (day 1). If you're still having symptoms in 3 days, take the second pill. Patient not taking: Reported on 07/21/2022 05/13/21   Hazel Sams, PA-C  ibuprofen (ADVIL) 600 MG tablet Take 1 tablet (600 mg total) by mouth every 8 (eight) hours as needed for up to 30 doses for mild pain or moderate pain (Chest wall pain). Patient not taking: Reported on 10/10/2022 10/08/22   Wyvonnia Dusky, MD  medroxyPROGESTERone (PROVERA) 10 MG tablet Take 1 tablet (10 mg total) by mouth daily for 10 days. 03/08/22 03/18/22  Simmons-Robinson, Riki Sheer, MD  metroNIDAZOLE (FLAGYL) 500 MG tablet Take 1 tablet (500 mg total) by mouth 2 (two) times daily. Patient not taking: Reported on 07/21/2022 05/11/22    Brunetta Jeans, PA-C  norgestimate-ethinyl estradiol (SPRINTEC 28) 0.25-35 MG-MCG tablet Take 1 tablet by mouth daily. Patient not taking: Reported on 07/21/2022 03/08/22   Eulis Foster, MD    Family History History reviewed. No pertinent family history.  Social History Social History   Tobacco Use   Smoking status: Never   Smokeless tobacco: Never  Vaping Use   Vaping Use: Never used  Substance Use Topics   Alcohol use: No   Drug use: No     Allergies   Patient has no known allergies.   Review of Systems Review of Systems  Genitourinary:  Positive for menstrual problem.     Physical Exam Triage Vital Signs ED Triage Vitals  Enc Vitals Group     BP 11/10/22 1311 124/79     Pulse Rate 11/10/22 1311 85     Resp 11/10/22 1311 16     Temp 11/10/22 1311 98.4 F (36.9 C)     Temp Source 11/10/22 1311 Oral     SpO2 11/10/22 1311 98 %     Weight --      Height --      Head Circumference --      Peak Flow --      Pain Score 11/10/22 1310 5     Pain Loc --      Pain Edu? --  Excl. in GC? --    No data found.  Updated Vital Signs BP 124/79 (BP Location: Left Arm)   Pulse 85   Temp 98.4 F (36.9 C) (Oral)   Resp 16   LMP 09/11/2022   SpO2 98%   Visual Acuity Right Eye Distance:   Left Eye Distance:   Bilateral Distance:    Right Eye Near:   Left Eye Near:    Bilateral Near:     Physical Exam Vitals and nursing note reviewed.  Constitutional:      Appearance: Normal appearance.  HENT:     Head: Normocephalic and atraumatic.  Eyes:     Pupils: Pupils are equal, round, and reactive to light.  Pulmonary:     Effort: Pulmonary effort is normal.  Skin:    General: Skin is warm and dry.  Neurological:     General: No focal deficit present.     Mental Status: She is alert and oriented to person, place, and time.  Psychiatric:        Mood and Affect: Mood normal.        Behavior: Behavior normal.      UC Treatments / Results   Labs (all labs ordered are listed, but only abnormal results are displayed) Labs Reviewed  BETA HCG QUANT (REF LAB)  POCT URINE PREGNANCY    EKG   Radiology No results found.  Procedures Procedures (including critical care time)  Medications Ordered in UC Medications - No data to display  Initial Impression / Assessment and Plan / UC Course  I have reviewed the triage vital signs and the nursing notes.  Pertinent labs & imaging results that were available during my care of the patient were reviewed by me and considered in my medical decision making (see chart for details).     Negative urine pregnancy in clinic.  Patient is requesting blood work Public librarian ordered and will contact if positive Patient to follow-up with PCP as needed Final Clinical Impressions(s) / UC Diagnoses   Final diagnoses:  Possible pregnancy  Missed menses     Discharge Instructions      The clinic will contact you with results of the blood work if positive Follow-up with your PCP as needed   ED Prescriptions   None    PDMP not reviewed this encounter.   Melynda Ripple, NP 11/10/22 1327

## 2022-11-10 NOTE — ED Triage Notes (Signed)
Patient presents for pregnancy testing, she reports having lower abd pain.  LMP: 09/11/22

## 2022-11-11 LAB — BETA HCG QUANT (REF LAB): hCG Quant: <1 m[IU]/mL

## 2022-11-21 ENCOUNTER — Ambulatory Visit: Payer: Medicaid Other | Admitting: Cardiology

## 2022-12-15 ENCOUNTER — Encounter (HOSPITAL_BASED_OUTPATIENT_CLINIC_OR_DEPARTMENT_OTHER): Payer: Self-pay | Admitting: Emergency Medicine

## 2022-12-15 ENCOUNTER — Other Ambulatory Visit: Payer: Self-pay

## 2022-12-15 ENCOUNTER — Emergency Department (HOSPITAL_BASED_OUTPATIENT_CLINIC_OR_DEPARTMENT_OTHER)
Admission: EM | Admit: 2022-12-15 | Discharge: 2022-12-15 | Disposition: A | Discharge status: Home / Self Care | Payer: Medicaid Other | Attending: Emergency Medicine | Admitting: Emergency Medicine

## 2022-12-15 ENCOUNTER — Emergency Department (HOSPITAL_BASED_OUTPATIENT_CLINIC_OR_DEPARTMENT_OTHER): Payer: Medicaid Other

## 2022-12-15 DIAGNOSIS — Z1152 Encounter for screening for COVID-19: Secondary | ICD-10-CM | POA: Diagnosis not present

## 2022-12-15 DIAGNOSIS — R062 Wheezing: Secondary | ICD-10-CM | POA: Diagnosis not present

## 2022-12-15 DIAGNOSIS — R0602 Shortness of breath: Secondary | ICD-10-CM | POA: Diagnosis not present

## 2022-12-15 DIAGNOSIS — Z7951 Long term (current) use of inhaled steroids: Secondary | ICD-10-CM | POA: Insufficient documentation

## 2022-12-15 DIAGNOSIS — J45909 Unspecified asthma, uncomplicated: Secondary | ICD-10-CM | POA: Diagnosis not present

## 2022-12-15 DIAGNOSIS — R0789 Other chest pain: Secondary | ICD-10-CM | POA: Diagnosis not present

## 2022-12-15 LAB — PREGNANCY, URINE: Preg Test, Ur: NEGATIVE

## 2022-12-15 LAB — RESP PANEL BY RT-PCR (RSV, FLU A&B, COVID)  RVPGX2
Influenza A by PCR: NEGATIVE
Influenza B by PCR: NEGATIVE
Resp Syncytial Virus by PCR: NEGATIVE
SARS Coronavirus 2 by RT PCR: NEGATIVE

## 2022-12-15 MED ORDER — ALBUTEROL SULFATE HFA 108 (90 BASE) MCG/ACT IN AERS
2.0000 | INHALATION_SPRAY | Freq: Once | RESPIRATORY_TRACT | Status: AC
  Administered 2022-12-15: 2 via RESPIRATORY_TRACT
  Filled 2022-12-15: qty 6.7

## 2022-12-15 MED ORDER — ALBUTEROL SULFATE HFA 108 (90 BASE) MCG/ACT IN AERS: 1.0000 | INHALATION_SPRAY | Freq: Four times a day (QID) | RESPIRATORY_TRACT | 0 refills | Status: DC | PRN

## 2022-12-15 MED ORDER — IPRATROPIUM-ALBUTEROL 0.5-2.5 (3) MG/3ML IN SOLN
3.0000 mL | Freq: Once | RESPIRATORY_TRACT | Status: AC
  Administered 2022-12-15: 3 mL via RESPIRATORY_TRACT
  Filled 2022-12-15: qty 3

## 2022-12-15 NOTE — ED Notes (Signed)
Patient sitting up in chair. Denies any needs at present. Friend at bedside.

## 2022-12-15 NOTE — ED Triage Notes (Signed)
Pt is c/o chest tightness, shortness of breath, and wheezing  Pt states her sxs started last night  Pt has an inhaler she has been using at home with minimal relief

## 2022-12-15 NOTE — ED Provider Notes (Signed)
[facility] Provider Note   CSN: [id number] Arrival date & time: 12/15/22  1850     History  Chief Complaint  Patient presents with   Shortness of Breath    Madison Schaefer is a 22 y.o. female with a past medical history of asthma presenting today due to shortness of breath.  She also says that she has some chest pain with it.  No history of DVT/PE, not on any OCPs, no recent travel, surgery or leg swelling.  Says that she tried to use her inhaler at home but she thinks it is expired so it did not help her.  She does say her last menstrual period was mid December.  She says that she had a blood pregnancy test last month that was negative.   Shortness of Breath Associated symptoms: no chest pain, no cough and no fever        Home Medications Prior to Admission medications   Medication Sig Start Date End Date Taking? Authorizing Provider  albuterol (VENTOLIN HFA) 108 (90 Base) MCG/ACT inhaler Inhale 1-2 puffs into the lungs every 6 (six) hours as needed for wheezing or shortness of breath. 12/15/22  Yes Kejon Feild A, PA-C  fluconazole (DIFLUCAN) 150 MG tablet Take 1 tablet (150 mg total) by mouth daily. -For your yeast infection, start the Diflucan (fluconazole)- Take one pill today (day 1). If you're still having symptoms in 3 days, take the second pill. Patient not taking: Reported on 07/21/2022 05/13/21   Hazel Sams, PA-C  ibuprofen (ADVIL) 600 MG tablet Take 1 tablet (600 mg total) by mouth every 8 (eight) hours as needed for up to 30 doses for mild pain or moderate pain (Chest wall pain). Patient not taking: Reported on 10/10/2022 10/08/22   Wyvonnia Dusky, MD  medroxyPROGESTERone (PROVERA) 10 MG tablet Take 1 tablet (10 mg total) by mouth daily for 10 days. 03/08/22 03/18/22  Simmons-Robinson, Riki Sheer, MD  metroNIDAZOLE (FLAGYL) 500 MG tablet Take 1 tablet (500 mg total) by mouth 2 (two) times daily. Patient not taking:  Reported on 07/21/2022 05/11/22   Brunetta Jeans, PA-C  norgestimate-ethinyl estradiol (SPRINTEC 28) 0.25-35 MG-MCG tablet Take 1 tablet by mouth daily. Patient not taking: Reported on 07/21/2022 03/08/22   Eulis Foster, MD      Allergies    Patient has no known allergies.    Review of Systems   Review of Systems  Constitutional:  Negative for chills and fever.  Respiratory:  Positive for chest tightness and shortness of breath. Negative for cough.   Cardiovascular:  Negative for chest pain and palpitations.    Physical Exam Updated Vital Signs BP (!) 140/89 (BP Location: Right Arm)   Pulse 87   Temp 98.2 F (36.8 C) (Oral)   Resp (!) 22   Ht 5' (1.524 m)   Wt 81.6 kg   SpO2 96%   BMI 35.15 kg/m  Physical Exam Vitals and nursing note reviewed.  Constitutional:      Appearance: Normal appearance.  HENT:     Head: Normocephalic and atraumatic.  Eyes:     General: No scleral icterus.    Conjunctiva/sclera: Conjunctivae normal.  Cardiovascular:     Rate and Rhythm: Normal rate and regular rhythm.  Pulmonary:     Effort: Pulmonary effort is normal. No respiratory distress.     Breath sounds: No decreased breath sounds, wheezing or rhonchi.  Skin:    Findings: No rash.  Neurological:  Mental Status: She is alert.  Psychiatric:        Mood and Affect: Mood normal.     ED Results / Procedures / Treatments   Labs (all labs ordered are listed, but only abnormal results are displayed) Labs Reviewed  RESP PANEL BY RT-PCR (RSV, FLU A&B, COVID)  RVPGX2    EKG None  Radiology DG Chest 2 View  Result Date: 12/15/2022 CLINICAL DATA:  Short of breath, chest tightness, wheezing EXAM: CHEST - 2 VIEW COMPARISON:  10/08/2022 FINDINGS: Frontal and lateral views of the chest demonstrate an unremarkable cardiac silhouette. No airspace disease, effusion, or pneumothorax. No acute bony abnormalities. IMPRESSION: 1. No acute intrathoracic process. Electronically  Signed   By: Randa Ngo M.D.   On: 12/15/2022 21:08    Procedures Procedures   Medications Ordered in ED Medications  ipratropium-albuterol (DUONEB) 0.5-2.5 (3) MG/3ML nebulizer solution 3 mL (3 mLs Nebulization Given 12/15/22 2022)  albuterol (VENTOLIN HFA) 108 (90 Base) MCG/ACT inhaler 2 puff (2 puffs Inhalation Given 12/15/22 2141)    ED Course/ Medical Decision Making/ A&P Clinical Course as of 12/15/22 2143  Fri Dec 15, 2022  2133 Patient says she feels much better after DuoNeb.  Does not believe lab work or further intervention is necessary at this time.  She is requesting an inhaler so we will give her 2 puffs and discharged her with the inhaler [MR]    Clinical Course User Index [MR] Sibley Rolison, Cecilio Asper, PA-C                             Medical Decision Making Amount and/or Complexity of Data Reviewed Labs: ordered. Radiology: ordered.  Risk Prescription drug management.   22 year old female presenting today for shortness of breath.  DDx includes but is not limited to PE, ACS, PTX, pneumonia, asthma exacerbation.  This is not an exhaustive differential.    Past Medical History / Co-morbidities / Social History: History of asthma   Physical Exam: Pertinent physical exam findings include Patient resting comfortably, no tachypnea. No wheezing on physical exam.  Normal heart rate with a regular rhythm  Lab Tests: I ordered, and personally interpreted labs.  The pertinent results include: Pregnancy negative Negative viral panel   Imaging Studies: I ordered and independently visualized and interpreted chest x-ray and I agree with the radiologist that there are no acute findings    Medications: Given DuoNeb and albuterol   MDM/Disposition: This is a 22 year old female who presented today with shortness of breath.  She says that she believes it is an asthma flare however her inhaler did not help her.  She believes it is expired.  No history of PE, recent travel  or surgery, OCP use, tobacco use or vaping.  Very low suspicion PE, patient is PERC negative.  She is young with no comorbidities for ACS.  EKG is nonconcerning, do not believe she needs lab work and troponins.  Chest x-ray without abnormalities.  I suspect she may be having some degree of an asthma flare.  Do not believe this is a major exacerbation.  She was given a DuoNeb and said that she felt better and would like to go home.  Her albuterol inhaler was refilled and she was given 1 to leave with today.  Return precautions, especially for PE, were discussed and patient was discharged in stable condition.  She will follow-up with PCP about this visit as well as her amenorrhea for the  past 3 months.   Final Clinical Impression(s) / ED Diagnoses Final diagnoses:  Shortness of breath    Rx / DC Orders ED Discharge Orders          Ordered    albuterol (VENTOLIN HFA) 108 (90 Base) MCG/ACT inhaler  Every 6 hours PRN        12/15/22 2016           Results and diagnoses were explained to the patient. Return precautions discussed in full. Patient had no additional questions and expressed complete understanding.   This chart was dictated using voice recognition software.  Despite best efforts to proofread,  errors can occur which can change the documentation meaning.     Darliss Ridgel 12/15/22 2148    Tretha Sciara, MD 12/19/22 [phone]

## 2022-12-15 NOTE — Discharge Instructions (Addendum)
You came to the emergency department today with shortness of breath.  You were treated with a breathing treatment and said that you felt much better.  If something changes, especially you have worsening shortness of breath, one of your legs begins to swell, you are unable to speak in complete sentences or start coughing up any blood, please come back.  I sent an albuterol inhaler to your pharmacy.  Please follow-up with [facility] PCP next week about your asthma and episode today.

## 2023-09-07 ENCOUNTER — Ambulatory Visit: Payer: 59

## 2023-09-11 NOTE — Progress Notes (Unsigned)
    SUBJECTIVE:   CHIEF COMPLAINT / HPI:   ***  PERTINENT  PMH / PSH: ***  OBJECTIVE:   There were no vitals taken for this visit.  ***  ASSESSMENT/PLAN:   No problem-specific Assessment & Plan notes found for this encounter.     Breawna Montenegro, MD  [facility]  

## 2023-09-12 ENCOUNTER — Ambulatory Visit: Payer: Medicaid Other | Admitting: Student

## 2023-09-20 ENCOUNTER — Ambulatory Visit (INDEPENDENT_AMBULATORY_CARE_PROVIDER_SITE_OTHER): Payer: 59 | Admitting: Student

## 2023-09-20 VITALS — BP 125/77 | HR 81 | Ht 65.0 in | Wt 223.4 lb

## 2023-09-20 DIAGNOSIS — N912 Amenorrhea, unspecified: Secondary | ICD-10-CM

## 2023-09-20 NOTE — Assessment & Plan Note (Signed)
Reviewed prior notes, history concerning for PCOS.  Given reported fatigue, we will also assess blood work for this and serum beta-hCG for amenorrhea.  If positive, she will need to return for prenatal visit.  If negative, she will need to return for amenorrhea workup.

## 2023-09-20 NOTE — Patient Instructions (Signed)
It was great to see you today! Thank you for choosing [facility] for your primary care.  Today we addressed: We will check for pregnancy and additionally check basic labs given your concern of fatigue.  If your pregnancy is positive, I recommend you return for prenatal workup.  If negative, you should return for further workup of amenorrhea (absence of menstrual periods).  If you haven't already, sign up for My Chart to have easy access to your labs results, and communication with your primary care physician.  Return in about 2 weeks (around 10/04/2023) for amenorrhea f/u. Please arrive 15 minutes before your appointment to ensure smooth check in process.  We appreciate your efforts in making this happen.  Thank you for allowing me to participate in your care, Madison Mattocks, DO 09/20/2023, 9:18 AM PGY-3, [facility]

## 2023-09-20 NOTE — Progress Notes (Signed)
  SUBJECTIVE:   CHIEF COMPLAINT / HPI:   Last took urine pregnancy test 1 week ago, which was negative.  Complaining of bloating/cramping, back pain and fatigue for the last 4 weeks. The pain has not been strong enough to warrant her taking any medications for pain. Notes irregular periods began in 2020. Denies any medication or supplement use. Not currently on any methods of birth control.  LMP 09/10/2022.  She did undergo progesterone withdrawal trial and then prescribed OCPs for withdrawal bleeding.  She reports she did have a menstrual period with this but did not continue OCPs because she did not want to be on birth control.  PERTINENT  PMH / PSH: Amenorrhea  OBJECTIVE:  BP 125/77   Pulse 81   Ht 5\' 5"  (1.651 m)   Wt 223 lb 6.4 oz (101.3 kg)   SpO2 100%   BMI 37.18 kg/m  General: Well-appearing, NAD CV: RRR, murmurs auscultated Abdomen: Soft, nontender, normoactive bowel sounds  ASSESSMENT/PLAN:   Assessment & Plan Amenorrhea Reviewed prior notes, history concerning for PCOS.  Given reported fatigue, we will also assess blood work for this and serum beta-hCG for amenorrhea.  If positive, she will need to return for prenatal visit.  If negative, she will need to return for amenorrhea workup.  Return in about 2 weeks (around 10/04/2023) for amenorrhea f/u. Shelby Mattocks, DO 09/20/2023, 9:18 AM PGY-3, [facility]

## 2023-09-21 LAB — CBC WITH DIFFERENTIAL/PLATELET
Basophils Absolute: 0.1 10*3/uL (ref 0.0–0.2)
Basos: 1 %
EOS (ABSOLUTE): 0.2 10*3/uL (ref 0.0–0.4)
Eos: 2 %
Hematocrit: 45.9 % (ref 34.0–46.6)
Hemoglobin: 13.9 g/dL (ref 11.1–15.9)
Immature Grans (Abs): 0 10*3/uL (ref 0.0–0.1)
Immature Granulocytes: 0 %
Lymphocytes Absolute: 3.5 10*3/uL — ABNORMAL HIGH (ref 0.7–3.1)
Lymphs: 34 %
MCH: 22.1 pg — ABNORMAL LOW (ref 26.6–33.0)
MCHC: 30.3 g/dL — ABNORMAL LOW (ref 31.5–35.7)
MCV: 73 fL — ABNORMAL LOW (ref 79–97)
Monocytes Absolute: 0.7 10*3/uL (ref 0.1–0.9)
Monocytes: 6 %
Neutrophils Absolute: 6 10*3/uL (ref 1.4–7.0)
Neutrophils: 57 %
Platelets: 346 10*3/uL (ref 150–450)
RBC: 6.28 x10E6/uL — ABNORMAL HIGH (ref 3.77–5.28)
RDW: 14.9 % (ref 11.7–15.4)
WBC: 10.5 10*3/uL (ref 3.4–10.8)

## 2023-09-21 LAB — COMPREHENSIVE METABOLIC PANEL
ALT: 11 [IU]/L (ref 0–32)
AST: 11 [IU]/L (ref 0–40)
Albumin: 4 g/dL (ref 4.0–5.0)
Alkaline Phosphatase: 90 [IU]/L (ref 44–121)
BUN/Creatinine Ratio: 11 (ref 9–23)
BUN: 8 mg/dL (ref 6–20)
Bilirubin Total: 0.3 mg/dL (ref 0.0–1.2)
CO2: 21 mmol/L (ref 20–29)
Calcium: 9.1 mg/dL (ref 8.7–10.2)
Chloride: 104 mmol/L (ref 96–106)
Creatinine, Ser: 0.71 mg/dL (ref 0.57–1.00)
Globulin, Total: 2.5 g/dL (ref 1.5–4.5)
Glucose: 100 mg/dL — ABNORMAL HIGH (ref 70–99)
Potassium: 4.2 mmol/L (ref 3.5–5.2)
Sodium: 141 mmol/L (ref 134–144)
Total Protein: 6.5 g/dL (ref 6.0–8.5)
eGFR: 124 mL/min/{1.73_m2} (ref >59)

## 2023-09-21 LAB — BETA HCG QUANT (REF LAB): hCG Quant: <1 m[IU]/mL

## 2023-09-21 LAB — TSH: TSH: 0.597 u[IU]/mL (ref 0.450–4.500)

## 2023-10-01 ENCOUNTER — Telehealth: Payer: Self-pay

## 2023-10-01 ENCOUNTER — Other Ambulatory Visit (HOSPITAL_COMMUNITY)
Admission: RE | Admit: 2023-10-01 | Discharge: 2023-10-01 | Disposition: A | Discharge status: Home / Self Care | Payer: 59 | Source: Ambulatory Visit | Attending: Family Medicine | Admitting: Family Medicine

## 2023-10-01 ENCOUNTER — Ambulatory Visit (INDEPENDENT_AMBULATORY_CARE_PROVIDER_SITE_OTHER): Payer: 59 | Admitting: Student

## 2023-10-01 ENCOUNTER — Encounter: Payer: Self-pay | Admitting: Student

## 2023-10-01 VITALS — BP 122/74 | HR 85 | Ht 60.0 in | Wt 222.2 lb

## 2023-10-01 DIAGNOSIS — N912 Amenorrhea, unspecified: Secondary | ICD-10-CM

## 2023-10-01 DIAGNOSIS — Z113 Encounter for screening for infections with a predominantly sexual mode of transmission: Secondary | ICD-10-CM | POA: Diagnosis not present

## 2023-10-01 DIAGNOSIS — Z124 Encounter for screening for malignant neoplasm of cervix: Secondary | ICD-10-CM

## 2023-10-01 NOTE — Progress Notes (Cosign Needed)
  SUBJECTIVE:   CHIEF COMPLAINT / HPI:   Amenorrhea Last period was 09/11/22, was seen in 10 or 11, started a pill that brought on her cycle, so started OCP but didn't want to continue OCP. Appreciates some weight gain over the last year. No acne or hirsutism. No changes in breast size or lactation from breast. Would prefer not to be on birth control, just preference, not attempting to get pregnant. Reports period has been irregular since 2019. Sexually active, doesn't use protection. Would like STI testing. Denies any symptoms or redness or irritation or discharge. Okay to leave a message with results on phone voicemail.     PERTINENT  PMH / PSH:   OBJECTIVE:  There were no vitals taken for this visit. Physical Exam Exam conducted with a chaperone present.  Constitutional:      General: She is not in acute distress.    Appearance: Normal appearance. She is not ill-appearing.  Genitourinary:    General: Normal vulva.     Labia:        Right: No rash, tenderness, lesion or injury.        Left: No rash, tenderness, lesion or injury.      Vagina: Normal. No signs of injury and foreign body. No vaginal discharge, erythema, tenderness, bleeding, lesions or prolapsed vaginal walls.     Cervix: Dilated. Cervical bleeding present. No cervical motion tenderness, discharge, friability, lesion or erythema.  Neurological:     Mental Status: She is alert.      ASSESSMENT/PLAN:   Assessment & Plan Amenorrhea Patient comes in for amenorrhea.  Patient reports she has not had a period in over a year.  Patient reports prior to she had irregular periods starting around 2019.  Patient does not use birth control, is sexually active, does not use protection.  Patient would like STI testing today.  Patient previously had progesterone withdrawal challenge, and began bleeding, was prescribed oral contraceptive pills, but did not take them as she does not want to be on birth control.  Patient is not actively  trying to become pregnant.  Patient appreciates some change in weight, but no hirsutism or acne, less likely for PCOS.  Will test for causes of a menorrhea, and STI testing.  Patient also due for Pap smear today, Pap collected.  Patient does not want birth control. - LH, TSH, prolactin, estradiol, free testosterone - Pap smear (gonorrhea, chlamydia, trichomoniasis) - HIV, RPR Screening examination for sexually transmitted disease Patient desires testing for STIs.  He denies any symptoms.  Will collect STI testing off Pap. Screening for cervical cancer Patient due for Pap today, Pap smear collected, STI testing off Pap smear. No follow-ups on file. Bess Kinds, MD 10/01/2023, 6:57 AM PGY-3, [facility]

## 2023-10-01 NOTE — Addendum Note (Signed)
Addended by: Veronda Prude on: 10/01/2023 04:42 PM   Modules accepted: Orders

## 2023-10-01 NOTE — Patient Instructions (Addendum)
It was great to see you! Thank you for allowing me to participate in your care!  I recommend that you always bring your medications to each appointment as this makes it easy to ensure we are on the correct medications and helps [place] not miss when refills are needed.  Our plans for today:  - Obtaining labs for Amenorrhea (lack of period) - Screening for Sexually Transmitted Infections - Pap Smear (Screening for Cervical Cancer)  We are checking some labs today, I will call you if they are abnormal will send you a MyChart message or a letter if they are normal.  If you do not hear about your labs in the next 2 weeks please let us know.  Take care and seek immediate care sooner if you develop any concerns.   Dr. Bess Kinds, MD [facility]

## 2023-10-01 NOTE — Assessment & Plan Note (Addendum)
Patient comes in for amenorrhea.  Patient reports she has not had a period in over a year.  Patient reports prior to she had irregular periods starting around 2019.  Patient does not use birth control, is sexually active, does not use protection.  Patient would like STI testing today.  Patient previously had progesterone withdrawal challenge, and began bleeding, was prescribed oral contraceptive pills, but did not take them as she does not want to be on birth control.  Patient is not actively trying to become pregnant.  Patient appreciates some change in weight, but no hirsutism or acne, less likely for PCOS.  Will test for causes of a menorrhea, and STI testing.  Patient also due for Pap smear today, Pap collected.  Patient does not want birth control. - LH, TSH, prolactin, estradiol, free testosterone - Pap smear (gonorrhea, chlamydia, trichomoniasis) - HIV, RPR

## 2023-10-01 NOTE — Telephone Encounter (Signed)
Received call from [facility] Cytology lab regarding patient. They are asking if patient received PAP and Aptima swab at today's visit, as both tests were ordered.   Spoke with Dr. Barbaraann Faster who advised that only PAP was obtained and that STI testing would be ran off of this. Discontinued cervicovaginal swab order.   Provided update to Cytology.   Veronda Prude, RN

## 2023-10-02 LAB — PROLACTIN: Prolactin: 15 ng/mL (ref 4.8–33.4)

## 2023-10-02 LAB — FOLLICLE STIMULATING HORMONE: FSH: 6.6 m[IU]/mL

## 2023-10-04 LAB — CYTOLOGY - PAP
Chlamydia: NEGATIVE
Comment: NEGATIVE
Comment: NEGATIVE
Comment: NORMAL
Diagnosis: NEGATIVE
Neisseria Gonorrhea: NEGATIVE
Trichomonas: NEGATIVE

## 2023-10-05 LAB — RPR W/REFLEX TO TREPSURE

## 2023-10-06 LAB — TREPONEMAL ANTIBODIES, TPPA: Treponemal Antibodies, TPPA: NONREACTIVE

## 2023-10-06 LAB — TESTOSTERONE, FREE, TOTAL, SHBG
Sex Hormone Binding: 24.2 nmol/L — ABNORMAL LOW (ref 24.6–122.0)
Testosterone, Free: 1.2 pg/mL (ref 0.0–4.2)
Testosterone: 33 ng/dL (ref 13–71)

## 2023-10-06 LAB — RPR W/REFLEX TO TREPSURE

## 2023-10-06 LAB — TSH RFX ON ABNORMAL TO FREE T4: TSH: 0.772 u[IU]/mL (ref 0.450–4.500)

## 2023-10-06 LAB — HIV ANTIBODY (ROUTINE TESTING W REFLEX): HIV Screen 4th Generation wRfx: NONREACTIVE

## 2023-10-06 LAB — ESTRADIOL: Estradiol: 55.4 pg/mL

## 2023-10-29 ENCOUNTER — Other Ambulatory Visit: Payer: Self-pay | Admitting: Student

## 2023-10-29 DIAGNOSIS — N912 Amenorrhea, unspecified: Secondary | ICD-10-CM

## 2023-10-29 NOTE — Progress Notes (Signed)
Patient had workup for amenorrhea that was completely negative.  Will make referral to OB/GYN for transvaginal ultrasound.

## 2023-12-04 ENCOUNTER — Ambulatory Visit: Payer: 59 | Admitting: Obstetrics & Gynecology

## 2023-12-17 ENCOUNTER — Ambulatory Visit (INDEPENDENT_AMBULATORY_CARE_PROVIDER_SITE_OTHER): Admitting: Student

## 2023-12-17 ENCOUNTER — Encounter: Payer: Self-pay | Admitting: Student

## 2023-12-17 ENCOUNTER — Other Ambulatory Visit (HOSPITAL_COMMUNITY)
Admission: RE | Admit: 2023-12-17 | Discharge: 2023-12-17 | Disposition: A | Discharge status: Home / Self Care | Source: Ambulatory Visit | Attending: Family Medicine | Admitting: Family Medicine

## 2023-12-17 VITALS — BP 118/81 | HR 78 | Ht 60.0 in | Wt 213.6 lb

## 2023-12-17 DIAGNOSIS — Z113 Encounter for screening for infections with a predominantly sexual mode of transmission: Secondary | ICD-10-CM | POA: Insufficient documentation

## 2023-12-17 NOTE — Progress Notes (Signed)
  SUBJECTIVE:   CHIEF COMPLAINT / HPI:   STI screen Patient comes in for routine STI screening.  Patient reports she has had redness, and discomfort in her vulva x 5 days.  Patient reports she is sexually active with 1 partner, does not use birth control, does not use protection.  Patient being seen by OB/GYN for amenorrhea.  Patient wanting STI testing today.  Denies any systemic symptoms.  Patient does not want birth control, and is not trying to get pregnant. Patient notes I can call and leave a message of results on her phone, or message her through mychart.Patient reports she has been using PHD soap and bath and body works soap on vulva recently.   PERTINENT  PMH / PSH:    OBJECTIVE:  BP 118/81   Pulse 78   Ht 5' (1.524 m)   Wt 213 lb 9.6 oz (96.9 kg)   SpO2 100%   BMI 41.72 kg/m  Physical Exam Exam conducted with a chaperone present.  Constitutional:      General: She is not in acute distress.    Appearance: Normal appearance. She is not ill-appearing.  Genitourinary:    General: Normal vulva.     Labia:        Right: No rash, tenderness, lesion or injury.        Left: No rash, tenderness, lesion or injury.      Vagina: No signs of injury and foreign body. Vaginal discharge present. No erythema, tenderness, bleeding, lesions or prolapsed vaginal walls.     Cervix: Erythema present. No cervical motion tenderness, discharge, lesion or cervical bleeding.  Neurological:     Mental Status: She is alert.      ASSESSMENT/PLAN:   Assessment & Plan Routine screening for STI (sexually transmitted infection) Patient comes in for routine STI screening.  Patient appreciates some vaginal irritation x 5 days.  Patient denies any vaginal discharge, or systemic symptoms.  Patient has been sexually active with 1 partner, not using birth control, not using protection.  Patient does not want birth control, is being worked up by OB/GYN for amenorrhea.  Patient wanting STI screening today.   Pelvic exam benign with vaginal discharge.  Patient also notes she has been using scented bathing products on her vulva, more recently, should STI screen to be negative, possible cause of irritation from bathing products.  Discussed to avoid fragrant products on vulva. - Testing for HSV, syphilis, HIV, trichomonas, gonorrhea, chlamydia, BV, yeast - Avoid scented fragrances body washes on vulva No follow-ups on file. Bess Kinds, MD 12/17/2023, 8:54 AM PGY-3, [facility]

## 2023-12-17 NOTE — Assessment & Plan Note (Addendum)
 Patient comes in for routine STI screening.  Patient appreciates some vaginal irritation x 5 days.  Patient denies any vaginal discharge, or systemic symptoms.  Patient has been sexually active with 1 partner, not using birth control, not using protection.  Patient does not want birth control, is being worked up by OB/GYN for amenorrhea.  Patient wanting STI screening today.  Pelvic exam benign with vaginal discharge.  Patient also notes she has been using scented bathing products on her vulva, more recently, should STI screen to be negative, possible cause of irritation from bathing products.  Discussed to avoid fragrant products on vulva. - Testing for HSV, syphilis, HIV, trichomonas, gonorrhea, chlamydia, BV, yeast - Avoid scented fragrances body washes on vulva

## 2023-12-17 NOTE — Patient Instructions (Addendum)
 It was great to see you! Thank you for allowing me to participate in your care!  Our plans for today:  - Routine Sexually Transmitted Infection testing Testing for HIV/Syphilis, Gonorrhea, Chlamydia, Trichomonas, BV and Yeast and Herpes  If all testing is negative, irritation may be coming from bathing products you are using. I recommend no fragrant washes in vulva area.   We are checking some labs today, I will call you if they are abnormal will send you a MyChart message or a letter if they are normal.  If you do not hear about your labs in the next 2 weeks please let us know.  Take care and seek immediate care sooner if you develop any concerns.   Dr. Bess Kinds, MD [facility]

## 2023-12-18 ENCOUNTER — Encounter: Payer: Self-pay | Admitting: Student

## 2023-12-18 LAB — CERVICOVAGINAL ANCILLARY ONLY
Bacterial Vaginitis (gardnerella): POSITIVE — AB
Candida Glabrata: NEGATIVE
Candida Vaginitis: NEGATIVE
Chlamydia: NEGATIVE
Comment: NEGATIVE
Comment: NEGATIVE
Comment: NEGATIVE
Comment: NEGATIVE
Comment: NEGATIVE
Comment: NORMAL
Neisseria Gonorrhea: NEGATIVE
Trichomonas: NEGATIVE

## 2023-12-19 ENCOUNTER — Other Ambulatory Visit: Payer: Self-pay | Admitting: Student

## 2023-12-19 LAB — HIV ANTIBODY (ROUTINE TESTING W REFLEX)

## 2023-12-19 LAB — RPR: RPR Ser Ql: NONREACTIVE

## 2023-12-19 MED ORDER — METRONIDAZOLE 0.75 % VA GEL: 1.0000 | Freq: Every day | VAGINAL | 0 refills | Status: AC

## 2023-12-19 NOTE — Progress Notes (Signed)
Metronidazole gel sent to pharmacy.

## 2023-12-21 LAB — HERPES SIMPLEX VIRUS CULTURE

## 2023-12-27 ENCOUNTER — Encounter: Payer: Self-pay | Admitting: Student

## 2024-01-09 ENCOUNTER — Ambulatory Visit: Admitting: Obstetrics and Gynecology

## 2024-01-09 ENCOUNTER — Encounter: Payer: Self-pay | Admitting: Obstetrics and Gynecology

## 2024-01-09 VITALS — BP 111/75 | HR 88 | Ht 60.0 in | Wt 211.0 lb

## 2024-01-09 DIAGNOSIS — Z1339 Encounter for screening examination for other mental health and behavioral disorders: Secondary | ICD-10-CM

## 2024-01-09 DIAGNOSIS — N911 Secondary amenorrhea: Secondary | ICD-10-CM | POA: Diagnosis not present

## 2024-01-09 NOTE — Progress Notes (Signed)
 NEW GYNECOLOGY VISIT Chief Complaint  Patient presents with   NEW PATIENT/ANNUAL    Subjective:  Madison Schaefer is a 23 y.o. presents as a referral for secondary amenorrhea.  Patient's last menstrual period was 09/11/2022 (exact date).  Reports no period x 1 year. Reports menarche age 14-10 with regular periods. Then when she was 17, she started to skip periods. She would skip periods for up to 8 months at a time. Often skip periods or go 3-4 months without a period more commonly. Then for the past year, no period.  She denies any nipple discharge, headaches, diarrhea or gluten intolerance, polydipsia. She endorses recently that she feels her vision isn't as good but describes it as blurry, no visual field defects. Endorses excess urination. Denies hair growth or acne. Denies any history of pregnancy or uterine procedures/instrumentation.  Work up with PCP: FSH 6.6  Prolactin 15 Estradiol 55.4 Testosterone total 33 Testosterone free 1.2 TSH 0.772  Gyn History: Sexually active: Yes Last pap: 2024 NILM History of abnormal pap: n/a Contraception: condoms Periods: see above  OB History     Gravida  0   Para  0   Term  0   Preterm  0   AB  0   Living  0      SAB  0   IAB  0   Ectopic  0   Multiple  0   Live Births  0           Past Medical History:  Diagnosis Date   Pneumonia    RSV (acute bronchiolitis due to respiratory syncytial virus)     History reviewed. No pertinent surgical history.  Social History   Socioeconomic History   Marital status: Single    Spouse name: Not on file   Number of children: 0   Years of education: Not on file   Highest education level: Not on file  Occupational History   Not on file  Tobacco Use   Smoking status: Never   Smokeless tobacco: Never  Vaping Use   Vaping status: Never Used  Substance and Sexual Activity   Alcohol use: No   Drug use: No   Sexual activity: Yes    Birth  control/protection: None, Condom  Other Topics Concern   Not on file  Social History Narrative   Not on file   Social Drivers of Health   Financial Resource Strain: Not on file  Food Insecurity: Not on file  Transportation Needs: No Transportation Needs (10/10/2022)   PRAPARE - Administrator, Civil Service (Medical): No    Lack of Transportation (Non-Medical): No  Physical Activity: Not on file  Stress: Not on file  Social Connections: Not on file    Family History  Problem Relation Age of Onset   Hypertension Mother     No current outpatient medications on file prior to visit.   No current facility-administered medications on file prior to visit.    No Known Allergies   Objective:   Vitals:   01/09/24 1032  BP: 111/75  Pulse: 88  Weight: 211 lb (95.7 kg)  Height: 5' (1.524 m)  Body mass index is 41.21 kg/m.  Physical Examination:   General appearance - well appearing, and in no distress  Mental status - alert, oriented to person, place, and time  Psych:  normal mood and affect  Skin - warm and dry, normal color, no suspicious lesions noted  Abdomen - soft, nontender,  nondistended, no masses or organomegaly  Pelvic -  VULVA: normal appearing vulva with no masses, tenderness or lesions   VAGINA: normal appearing vagina with normal color and discharge, no lesions   CERVIX: normal appearing cervix without discharge or lesions, no CMT  UTERUS: uterus is felt to be normal size, shape, consistency and nontender   ADNEXA: No adnexal masses or tenderness noted.  Extremities:  No swelling or varicosities noted  Chaperone present for exam  Assessment and Plan:  RIKAYLA DEMMON is a 23 y.o. with secondary amenorrhea  1. Secondary amenorrhea (Primary) Unclear etiology at this time. Labs largely within normal limits with PCP. Will repeat FSH and estradiol with LH to determine where it is compared to her [facility]. She doesn't have classic symptoms for PCOS  however her weight does put her at risk for this. Will get ultrasound for evaluation. No neurological symptoms to suggest brain lesion however if workup is normal, will consider MRI brain for further evaluation and exclusion of sellar mass, UPT negative today - Hemoglobin A1c - Follicle stimulating hormone - Estradiol - LH - US PELVIC COMPLETE WITH TRANSVAGINAL; Future   No follow-ups on file.  No future appointments.  Wanita Chamberlain, MD

## 2024-01-09 NOTE — Progress Notes (Signed)
 23 y.o. New GYN presents for Amenorrhea,  LMP 09/11/2022. Pt stated that her periods were regular prior to 2019.   Pt sometimes uses a Condom for [facility].  UPT last month was Negative.

## 2024-01-15 ENCOUNTER — Ambulatory Visit (HOSPITAL_COMMUNITY)
Admission: RE | Admit: 2024-01-15 | Discharge: 2024-01-15 | Disposition: A | Discharge status: Home / Self Care | Source: Ambulatory Visit | Attending: Obstetrics and Gynecology | Admitting: Obstetrics and Gynecology

## 2024-01-15 DIAGNOSIS — N911 Secondary amenorrhea: Secondary | ICD-10-CM | POA: Insufficient documentation

## 2024-01-17 ENCOUNTER — Encounter: Payer: Self-pay | Admitting: Obstetrics and Gynecology

## 2024-03-11 ENCOUNTER — Encounter: Payer: Self-pay | Admitting: Physician Assistant

## 2024-03-11 ENCOUNTER — Ambulatory Visit: Admitting: Physician Assistant

## 2024-03-11 ENCOUNTER — Encounter: Payer: Self-pay | Admitting: *Deleted

## 2024-03-12 ENCOUNTER — Ambulatory Visit (INDEPENDENT_AMBULATORY_CARE_PROVIDER_SITE_OTHER): Admitting: Physician Assistant

## 2024-03-12 VITALS — BP 124/82 | HR 92

## 2024-03-12 DIAGNOSIS — Z111 Encounter for screening for respiratory tuberculosis: Secondary | ICD-10-CM

## 2024-03-12 DIAGNOSIS — Z113 Encounter for screening for infections with a predominantly sexual mode of transmission: Secondary | ICD-10-CM | POA: Diagnosis not present

## 2024-03-12 NOTE — Progress Notes (Signed)
 Patient ID: [name], female   DOB: [birth year], 23 y.o.   MRN: [id number]   Madison Schaefer, is a 23 y.o. female  [id number]  [id number]  DOB - [birth year]  Chief Complaint  Patient presents with   New Patient (Initial Visit)       Subjective:   Madison Schaefer is a 23 y.o. female here today for  STD screening and TB test...she prefers to do quantiferon as opposed to TB test bc she can't come Friday to have it ready.    No problems updated.  ALLERGIES: No Known Allergies  PAST MEDICAL HISTORY: Past Medical History:  Diagnosis Date   Pneumonia    RSV (acute bronchiolitis due to respiratory syncytial virus)     MEDICATIONS AT HOME: Prior to Admission medications   Not on File    ROS: Neg HEENT Neg resp Neg cardiac Neg GI Neg GU Neg MS Neg psych Neg neuro  Objective:   Vitals:   03/12/24 1338  BP: 124/82  Pulse: 92  SpO2: 98%   Exam General appearance : Awake, alert, not in any distress. Speech Clear. Not toxic looking HEENT: Atraumatic and Normocephalic Neck: Supple, no JVD. No cervical lymphadenopathy.  Chest: Good air entry bilaterally, CTAB.  No rales/rhonchi/wheezing CVS: S1 S2 regular, no murmurs.  Extremities: B/L Lower Ext shows no edema, both legs are warm to touch Neurology: Awake alert, and oriented X 3, CN II-XII intact, Non focal Skin: No Rash  Data Review No results found for: HGBA1C  Assessment & Plan   1. Screening examination for STD (sexually transmitted disease) (Primary) Safe sex practices.  - Cervicovaginal ancillary only - HIV antibody (with reflex) - RPR  2. Screening-pulmonary TB - QuantiFERON-TB Gold Plus    Return if symptoms worsen or fail to improve.  The patient was given clear instructions to go to ER or return to medical center if symptoms don't improve, worsen or new problems develop. The patient verbalized understanding. The patient was told to call to get lab results if they  haven't heard anything in the next week.      Dulce Gibbs, PA-C [facility], [place] [phone]   03/12/2024, 1:54 PM

## 2024-03-13 ENCOUNTER — Ambulatory Visit: Admitting: Family Medicine

## 2024-03-13 LAB — CERVICOVAGINAL ANCILLARY ONLY
Bacterial Vaginitis (gardnerella): NEGATIVE
Candida Glabrata: NEGATIVE
Candida Vaginitis: NEGATIVE
Chlamydia: NEGATIVE
Comment: NEGATIVE
Comment: NEGATIVE
Comment: NEGATIVE
Comment: NEGATIVE
Comment: NEGATIVE
Comment: NORMAL
Neisseria Gonorrhea: NEGATIVE
Trichomonas: NEGATIVE

## 2024-03-17 ENCOUNTER — Ambulatory Visit: Payer: Self-pay | Admitting: Physician Assistant

## 2024-03-17 LAB — QUANTIFERON-TB GOLD PLUS
QuantiFERON Mitogen Value: >10 [IU]/mL
QuantiFERON Nil Value: 0.03 [IU]/mL
QuantiFERON TB1 Ag Value: 0.04 [IU]/mL
QuantiFERON TB2 Ag Value: 0.04 [IU]/mL
QuantiFERON-TB Gold Plus: NEGATIVE

## 2024-03-17 LAB — RPR: RPR Ser Ql: NONREACTIVE

## 2024-03-17 LAB — HIV ANTIBODY (ROUTINE TESTING W REFLEX): HIV Screen 4th Generation wRfx: NONREACTIVE

## 2024-08-26 ENCOUNTER — Ambulatory Visit

## 2024-08-26 DIAGNOSIS — Z23 Encounter for immunization: Secondary | ICD-10-CM

## 2024-09-30 ENCOUNTER — Ambulatory Visit

## 2024-10-22 ENCOUNTER — Telehealth: Payer: Self-pay

## 2024-10-22 ENCOUNTER — Ambulatory Visit

## 2024-10-22 NOTE — Telephone Encounter (Signed)
 Pt has never been seen by a provider at our practice. Will need an OV

## 2024-10-22 NOTE — Telephone Encounter (Signed)
 Pt has scheduled appt with an UC

## 2024-10-22 NOTE — Telephone Encounter (Signed)
 Copied from CRM #[id number]. Topic: Clinical - Request for Lab/Test Order >> Oct 22, 2024 10:21 AM Viola FALCON wrote: Reason for CRM: Patient called for STD testing this week - please call patient when order is in to schedule.  [phone]

## 2024-10-22 NOTE — Telephone Encounter (Signed)
 error

## 2024-10-23 ENCOUNTER — Ambulatory Visit

## 2024-10-25 ENCOUNTER — Telehealth: Admitting: Family Medicine

## 2024-10-25 DIAGNOSIS — B9689 Other specified bacterial agents as the cause of diseases classified elsewhere: Secondary | ICD-10-CM | POA: Diagnosis not present

## 2024-10-25 DIAGNOSIS — N760 Acute vaginitis: Secondary | ICD-10-CM | POA: Diagnosis not present

## 2024-10-25 MED ORDER — METRONIDAZOLE 500 MG PO TABS: 500.0000 mg | ORAL_TABLET | Freq: Two times a day (BID) | ORAL | 0 refills | Status: AC

## 2024-10-25 NOTE — Progress Notes (Signed)
 We are sorry that you are not feeling well. Here is how we plan to help! Based on what you shared with me it looks like you: May have a vaginosis due to bacteria  Vaginosis is an inflammation of the vagina that can result in discharge, itching and pain. The cause is usually a change in the normal balance of vaginal bacteria or an infection. Vaginosis can also result from reduced estrogen levels after menopause.  The most common causes of vaginosis are:   Bacterial vaginosis which results from an overgrowth of one on several organisms that are normally present in your vagina.   Yeast infections which are caused by a naturally occurring fungus called candida.   Vaginal atrophy (atrophic vaginosis) which results from the thinning of the vagina from reduced estrogen levels after menopause.   Trichomoniasis which is caused by a parasite and is commonly transmitted by sexual intercourse.  Factors that increase your risk of developing vaginosis include: Medications, such as antibiotics and steroids Uncontrolled diabetes Use of hygiene products such as bubble bath, vaginal spray or vaginal deodorant Douching Wearing damp or tight-fitting clothing Using an intrauterine device (IUD) for birth control Hormonal changes, such as those associated with pregnancy, birth control pills or menopause Sexual activity Having a sexually transmitted infection  Your treatment plan is Metronidazole  or Flagyl  500mg  twice a day for 7 days.  I have electronically sent this prescription into the pharmacy that you have chosen.  Be sure to take all of the medication as directed. Stop taking any medication if you develop a rash, tongue swelling or shortness of breath. Mothers who are breast feeding should consider pumping and discarding their breast milk while on these antibiotics. However, there is no consensus that infant exposure at these doses would be harmful.  Remember that medication creams can weaken latex  condoms.   HOME CARE:  Good hygiene may prevent some types of vaginosis from recurring and may relieve some symptoms:  Avoid baths, hot tubs and whirlpool spas. Rinse soap from your outer genital area after a shower, and dry the area well to prevent irritation. Don't use scented or harsh soaps, such as those with deodorant or antibacterial action. Avoid irritants. These include scented tampons and pads. Wipe from front to back after using the toilet. Doing so avoids spreading fecal bacteria to your vagina.  Other things that may help prevent vaginosis include:  Don't douche. Your vagina doesn't require cleansing other than normal bathing. Repetitive douching disrupts the normal organisms that reside in the vagina and can actually increase your risk of vaginal infection. Douching won't clear up a vaginal infection. Use a latex condom. Both female and female latex condoms may help you avoid infections spread by sexual contact. Wear cotton underwear. Also wear pantyhose with a cotton crotch. If you feel comfortable without it, skip wearing underwear to bed. Yeast thrives in [employer] Your symptoms should improve in the next day or two.  GET HELP RIGHT AWAY IF:  You have pain in your lower abdomen ( pelvic area or over your ovaries) You develop nausea or vomiting You develop a fever Your discharge changes or worsens You have persistent pain with intercourse You develop shortness of breath, a rapid pulse, or you faint.  These symptoms could be signs of problems or infections that need to be evaluated by a medical provider now.  MAKE SURE YOU   Understand these instructions. Will watch your condition. Will get help right away if you are not doing  well or get worse.  Your e-visit answers were reviewed by a board certified advanced clinical practitioner to complete your personal care plan. Depending upon the condition, your plan could have included both over the counter or  prescription medications. Please review your pharmacy choice to make sure that you have choses a pharmacy that is open for you to pick up any needed prescription, Your safety is important to us . If you have drug allergies check your prescription carefully.   You can use MyChart to ask questions about today's visit, request a non-urgent call back, or ask for a work or school excuse for 24 hours related to this e-Visit. If it has been greater than 24 hours you will need to follow up with your provider, or enter a new e-Visit to address those concerns. You will get a MyChart message within the next two days asking about your experience. I hope that your e-visit has been valuable and will speed your recovery.  I have spent 5 minutes in review of e-visit questionnaire, review and updating patient chart, medical decision making and response to patient.   Helyn Schwan, FNP

## 2024-10-28 ENCOUNTER — Ambulatory Visit
Admission: RE | Admit: 2024-10-28 | Discharge: 2024-10-28 | Disposition: A | Discharge status: Home / Self Care | Source: Ambulatory Visit | Attending: Family Medicine | Admitting: Family Medicine

## 2024-10-28 VITALS — BP 138/87 | HR 71 | Temp 98.6°F | Resp 20

## 2024-10-28 DIAGNOSIS — N760 Acute vaginitis: Secondary | ICD-10-CM | POA: Diagnosis not present

## 2024-10-28 DIAGNOSIS — Z113 Encounter for screening for infections with a predominantly sexual mode of transmission: Secondary | ICD-10-CM | POA: Diagnosis not present

## 2024-10-28 MED ORDER — FLUCONAZOLE 150 MG PO TABS: 150.0000 mg | ORAL_TABLET | ORAL | 0 refills | Status: DC

## 2024-10-28 NOTE — ED Provider Notes (Signed)
 " [facility]  Note:  This document was prepared using Conservation officer, historic buildings and may include unintentional dictation errors.  MRN: [id number] DOB: [birth year]  Subjective:   Madison Schaefer is a 24 y.o. female presenting for 4-day history of vaginal discharge, vaginal irritation and itching.  Would like complete STI check.  She did a video visit and is currently on metronidazole  which helped some of the discharge but not the vaginal irritation and itching.  No known exposures.   Denies fever, n/v, abdominal pain, pelvic pain, rashes, dysuria, urinary frequency, hematuria.    Current Outpatient Medications  Medication Instructions   metroNIDAZOLE  (FLAGYL ) 500 mg, Oral, 2 times daily    Allergies[1]  Past Medical History:  Diagnosis Date   Pneumonia    RSV (acute bronchiolitis due to respiratory syncytial virus)      History reviewed. No pertinent surgical history.  Family History  Problem Relation Age of Onset   Hypertension Mother     Social History   Occupational History   Not on file  Tobacco Use   Smoking status: Never   Smokeless tobacco: Never  Vaping Use   Vaping status: Never Used  Substance and Sexual Activity   Alcohol use: No   Drug use: No   Sexual activity: Yes    Birth control/protection: None     ROS   Objective:   Vitals: BP 138/87 (BP Location: Right Arm)   Pulse 71   Temp 98.6 F (37 C) (Oral)   Resp 20   LMP 10/13/2024   SpO2 98%   Physical Exam Constitutional:      General: She is not in acute distress.    Appearance: Normal appearance. She is well-developed. She is not ill-appearing, toxic-appearing or diaphoretic.  HENT:     Head: Normocephalic and atraumatic.     Nose: Nose normal.     Mouth/Throat:     Mouth: Mucous membranes are moist.  Eyes:     General: No scleral icterus.       Right eye: No discharge.        Left eye: No discharge.     Extraocular Movements: Extraocular  movements intact.     Conjunctiva/sclera: Conjunctivae normal.  Cardiovascular:     Rate and Rhythm: Normal rate.  Pulmonary:     Effort: Pulmonary effort is normal.  Abdominal:     General: Bowel sounds are normal. There is no distension.     Palpations: Abdomen is soft. There is no mass.     Tenderness: There is no abdominal tenderness. There is no right CVA tenderness, left CVA tenderness, guarding or rebound.  Skin:    General: Skin is warm and dry.  Neurological:     General: No focal deficit present.     Mental Status: She is alert and oriented to person, place, and time.  Psychiatric:        Mood and Affect: Mood normal.        Behavior: Behavior normal.        Thought Content: Thought content normal.        Judgment: Judgment normal.     Assessment and Plan :   PDMP not reviewed this encounter.  1. Acute vaginitis   2. Routine screening for STI (sexually transmitted infection)      Maintain metronidazole , add fluconazole  to address acute vaginitis.  Labs pending, will make adjustments as necessary.  Counseled patient on potential for adverse effects with medications  prescribed/recommended today, ER and return-to-clinic precautions discussed, patient verbalized understanding.     [1] No Known Allergies    Christopher Savannah, PA-C 10/28/24 1400  "

## 2024-10-28 NOTE — Discharge Instructions (Signed)
 Your test results should be available tomorrow.  Our results team will let you know if you need any changes to treatment for any positive test results.

## 2024-10-28 NOTE — ED Triage Notes (Signed)
 Pt c/o vaginal d/c x 4 days-requesting STD testing-NAD-steady gait

## 2024-10-29 LAB — CERVICOVAGINAL ANCILLARY ONLY
Bacterial Vaginitis (gardnerella): POSITIVE — AB
Candida Glabrata: NEGATIVE
Candida Vaginitis: POSITIVE — AB
Chlamydia: NEGATIVE
Comment: NEGATIVE
Comment: NEGATIVE
Comment: NEGATIVE
Comment: NEGATIVE
Comment: NEGATIVE
Comment: NORMAL
Neisseria Gonorrhea: NEGATIVE
Trichomonas: NEGATIVE

## 2024-10-29 LAB — SYPHILIS: RPR W/REFLEX TO RPR TITER AND TREPONEMAL ANTIBODIES, TRADITIONAL SCREENING AND DIAGNOSIS ALGORITHM: RPR Ser Ql: NONREACTIVE

## 2024-10-29 LAB — HIV ANTIBODY (ROUTINE TESTING W REFLEX): HIV Screen 4th Generation wRfx: NONREACTIVE

## 2024-10-31 ENCOUNTER — Ambulatory Visit (HOSPITAL_COMMUNITY): Payer: Self-pay

## 2024-11-07 ENCOUNTER — Telehealth: Admitting: Family Medicine

## 2024-11-07 DIAGNOSIS — B3731 Acute candidiasis of vulva and vagina: Secondary | ICD-10-CM

## 2024-11-07 MED ORDER — FLUCONAZOLE 150 MG PO TABS: 150.0000 mg | ORAL_TABLET | ORAL | 0 refills | Status: AC

## 2024-11-07 NOTE — Progress Notes (Signed)
